# Patient Record
Sex: Male | Born: 1935 | Race: White | Hispanic: No | Marital: Married | Smoking: Former smoker
Health system: Southern US, Community
[De-identification: ages and names within clinical notes are randomized; demographics above are authoritative.]

## PROBLEM LIST (undated history)

## (undated) DIAGNOSIS — I10 Essential (primary) hypertension: Secondary | ICD-10-CM

## (undated) DIAGNOSIS — I495 Sick sinus syndrome: Secondary | ICD-10-CM

## (undated) DIAGNOSIS — K219 Gastro-esophageal reflux disease without esophagitis: Secondary | ICD-10-CM

## (undated) DIAGNOSIS — Z87442 Personal history of urinary calculi: Secondary | ICD-10-CM

## (undated) DIAGNOSIS — C61 Malignant neoplasm of prostate: Secondary | ICD-10-CM

## (undated) DIAGNOSIS — Z950 Presence of cardiac pacemaker: Secondary | ICD-10-CM

## (undated) DIAGNOSIS — D649 Anemia, unspecified: Secondary | ICD-10-CM

## (undated) DIAGNOSIS — E785 Hyperlipidemia, unspecified: Secondary | ICD-10-CM

## (undated) DIAGNOSIS — R32 Unspecified urinary incontinence: Secondary | ICD-10-CM

## (undated) DIAGNOSIS — I4891 Unspecified atrial fibrillation: Secondary | ICD-10-CM

## (undated) HISTORY — PX: OTHER SURGICAL HISTORY: SHX169

## (undated) HISTORY — PX: INSERT / REPLACE / REMOVE PACEMAKER: SUR710

## (undated) HISTORY — DX: Unspecified atrial fibrillation: I48.91

## (undated) HISTORY — DX: Malignant neoplasm of prostate: C61

## (undated) HISTORY — DX: Sick sinus syndrome: I49.5

## (undated) HISTORY — DX: Essential (primary) hypertension: I10

## (undated) HISTORY — DX: Unspecified urinary incontinence: R32

## (undated) HISTORY — DX: Hyperlipidemia, unspecified: E78.5

## (undated) HISTORY — PX: EYE SURGERY: SHX253

## (undated) HISTORY — PX: APPENDECTOMY: SHX54

---

## 2004-10-20 ENCOUNTER — Ambulatory Visit: Payer: Self-pay | Admitting: Gastroenterology

## 2005-09-02 ENCOUNTER — Ambulatory Visit: Payer: Self-pay | Admitting: Internal Medicine

## 2007-06-14 ENCOUNTER — Ambulatory Visit: Payer: Self-pay | Admitting: Internal Medicine

## 2008-12-13 ENCOUNTER — Ambulatory Visit: Payer: Self-pay | Admitting: Internal Medicine

## 2009-02-11 ENCOUNTER — Ambulatory Visit: Payer: Self-pay | Admitting: Gastroenterology

## 2010-03-05 ENCOUNTER — Ambulatory Visit: Payer: Self-pay | Admitting: Otolaryngology

## 2010-03-24 ENCOUNTER — Observation Stay: Payer: Self-pay | Admitting: Internal Medicine

## 2011-03-01 ENCOUNTER — Ambulatory Visit: Payer: Self-pay | Admitting: Gastroenterology

## 2011-04-07 ENCOUNTER — Ambulatory Visit: Payer: Self-pay | Admitting: Gastroenterology

## 2011-05-18 ENCOUNTER — Telehealth: Payer: Self-pay

## 2011-05-18 DIAGNOSIS — R933 Abnormal findings on diagnostic imaging of other parts of digestive tract: Secondary | ICD-10-CM

## 2011-05-18 NOTE — Telephone Encounter (Signed)
Pt has been instructed and meds reviewed, he will call with any further questions or concerns.  Letter sent to Dr Judithann Sheen regarding Pradaxa

## 2011-05-27 ENCOUNTER — Telehealth: Payer: Self-pay

## 2011-05-27 NOTE — Telephone Encounter (Signed)
Message copied by Donata Duff on Thu May 27, 2011  9:46 AM ------      Message from: Donata Duff      Created: Tue May 18, 2011  2:21 PM       Waiting for anti coag results

## 2011-05-27 NOTE — Telephone Encounter (Signed)
I spoke with Candice at Dr Judithann Sheen office and she is getting the fax to the nurse for review

## 2011-05-27 NOTE — Telephone Encounter (Signed)
Call placed to the pt regarding his Pradaxa message left with his wife

## 2011-05-27 NOTE — Telephone Encounter (Signed)
William Ryan with Dr Judithann Sheen called and did not receive a fax she gave me an alternate number to use [phone].  I did refax to that number attn to [place]

## 2011-05-28 NOTE — Telephone Encounter (Signed)
Response from Dr Judithann Sheen ok to stop Pradaxa 5 days before procedure Pt aware  Letter scanned to [facility]

## 2011-05-28 NOTE — Telephone Encounter (Signed)
I called and spoke with Dottie at Dr Judithann Sheen office and she is getting a note back to Dr Judithann Sheen for a response when he returns from lunch around 130 pm

## 2011-06-01 ENCOUNTER — Ambulatory Visit: Payer: Self-pay | Admitting: Internal Medicine

## 2011-06-03 ENCOUNTER — Encounter: Payer: Self-pay | Admitting: Gastroenterology

## 2011-06-03 ENCOUNTER — Ambulatory Visit: Payer: Self-pay

## 2011-06-04 ENCOUNTER — Telehealth: Payer: Self-pay

## 2011-06-04 NOTE — Telephone Encounter (Signed)
Dr Christella Hartigan the [facility] pathologist called with results for the pt, he had a positive result for B cell lymphoma the path report is being faxed as well as soon as it is signed off.

## 2011-06-05 LAB — PATHOLOGY REPORT

## 2011-06-07 ENCOUNTER — Telehealth: Payer: Self-pay | Admitting: Gastroenterology

## 2011-06-07 NOTE — Telephone Encounter (Signed)
See other phone note

## 2011-06-07 NOTE — Telephone Encounter (Signed)
i spoke with him about EUS FNA results.  B cell lymphoma.  I discussed with Dr. Bluford Kaufmann as well. Dr. Bluford Kaufmann will be referring him to [facility].

## 2011-06-08 ENCOUNTER — Encounter: Payer: Self-pay | Admitting: Gastroenterology

## 2011-06-14 ENCOUNTER — Ambulatory Visit: Payer: Self-pay | Admitting: Internal Medicine

## 2011-06-15 LAB — COMPREHENSIVE METABOLIC PANEL
Albumin: 3.6 g/dL (ref 3.4–5.0)
Alkaline Phosphatase: 68 U/L (ref 50–136)
BUN: 17 mg/dL (ref 7–18)
Chloride: 103 mmol/L (ref 98–107)
Co2: 30 mmol/L (ref 21–32)
EGFR (African American): >60
EGFR (Non-African Amer.): 52 — ABNORMAL LOW
Glucose: 167 mg/dL — ABNORMAL HIGH (ref 65–99)
Osmolality: 288 (ref 275–301)
Potassium: 4.2 mmol/L (ref 3.5–5.1)
SGOT(AST): 25 U/L (ref 15–37)
SGPT (ALT): 26 U/L
Sodium: 142 mmol/L (ref 136–145)
Total Protein: 7 g/dL (ref 6.4–8.2)

## 2011-06-15 LAB — CBC CANCER CENTER
Basophil #: 0.1 x10 3/mm (ref 0.0–0.1)
Eosinophil #: 0.1 x10 3/mm (ref 0.0–0.7)
HCT: 41.4 % (ref 40.0–52.0)
HGB: 13.9 g/dL (ref 13.0–18.0)
Lymphocyte #: 3.3 x10 3/mm (ref 1.0–3.6)
Lymphocytes: 44 %
MCHC: 33.7 g/dL (ref 32.0–36.0)
MCV: 91 fL (ref 80–100)
Monocyte %: 6.7 %
Neutrophil #: 4.1 x10 3/mm (ref 1.4–6.5)
Neutrophil %: 50.9 %
Platelet: 187 x10 3/mm (ref 150–440)
RBC: 4.55 10*6/uL (ref 4.40–5.90)
RDW: 15.2 % — ABNORMAL HIGH (ref 11.5–14.5)
WBC: 8 x10 3/mm (ref 3.8–10.6)

## 2011-06-15 LAB — RETICULOCYTES: Absolute Retic Count: 0.061 10*6/uL (ref 0.024–0.084)

## 2011-06-15 LAB — APTT: Activated PTT: 53 secs — ABNORMAL HIGH (ref 23.6–35.9)

## 2011-06-22 ENCOUNTER — Ambulatory Visit: Payer: Self-pay | Admitting: Internal Medicine

## 2011-07-02 ENCOUNTER — Ambulatory Visit: Payer: Self-pay | Admitting: Internal Medicine

## 2011-09-21 ENCOUNTER — Ambulatory Visit: Payer: Self-pay | Admitting: Internal Medicine

## 2011-09-21 LAB — LACTATE DEHYDROGENASE: LDH: 181 U/L (ref 87–241)

## 2011-09-21 LAB — CBC CANCER CENTER
Basophil #: 0 x10 3/mm (ref 0.0–0.1)
Eosinophil #: 0 x10 3/mm (ref 0.0–0.7)
HCT: 42.4 % (ref 40.0–52.0)
Lymphocyte #: 3.2 x10 3/mm (ref 1.0–3.6)
Lymphocyte %: 42.2 %
MCH: 29.6 pg (ref 26.0–34.0)
MCV: 92 fL (ref 80–100)
RBC: 4.59 10*6/uL (ref 4.40–5.90)
RDW: 14.5 % (ref 11.5–14.5)
WBC: 7.6 x10 3/mm (ref 3.8–10.6)

## 2011-09-21 LAB — CREATININE, SERUM
EGFR (African American): >60
EGFR (Non-African Amer.): 53 — ABNORMAL LOW

## 2011-09-29 ENCOUNTER — Ambulatory Visit: Payer: Self-pay | Admitting: Internal Medicine

## 2011-11-15 ENCOUNTER — Emergency Department: Payer: Self-pay | Admitting: *Deleted

## 2011-11-30 ENCOUNTER — Ambulatory Visit: Payer: Self-pay | Admitting: Internal Medicine

## 2011-12-21 ENCOUNTER — Ambulatory Visit: Payer: Self-pay | Admitting: Internal Medicine

## 2011-12-21 LAB — CBC CANCER CENTER
Basophil %: 0.4 %
Eosinophil %: 1.2 %
HCT: 44.9 % (ref 40.0–52.0)
HGB: 14.3 g/dL (ref 13.0–18.0)
Lymphocyte #: 3.4 x10 3/mm (ref 1.0–3.6)
MCH: 29.6 pg (ref 26.0–34.0)
MCHC: 31.8 g/dL — ABNORMAL LOW (ref 32.0–36.0)
MCV: 93 fL (ref 80–100)
Monocyte #: 0.6 x10 3/mm (ref 0.2–1.0)
Monocyte %: 8 %
Neutrophil #: 3.2 x10 3/mm (ref 1.4–6.5)
RBC: 4.83 10*6/uL (ref 4.40–5.90)

## 2011-12-21 LAB — CREATININE, SERUM: EGFR (Non-African Amer.): 48 — ABNORMAL LOW

## 2011-12-30 ENCOUNTER — Ambulatory Visit: Payer: Self-pay | Admitting: Internal Medicine

## 2012-03-21 ENCOUNTER — Ambulatory Visit: Payer: Self-pay | Admitting: Internal Medicine

## 2012-03-21 LAB — CREATININE, SERUM: EGFR (African American): 54 — ABNORMAL LOW

## 2012-03-21 LAB — CBC CANCER CENTER
Basophil #: 0.1 x10 3/mm (ref 0.0–0.1)
Basophil %: 0.9 %
Eosinophil #: 0.1 x10 3/mm (ref 0.0–0.7)
Eosinophil %: 0.9 %
HCT: 41.5 % (ref 40.0–52.0)
HGB: 13.4 g/dL (ref 13.0–18.0)
Lymphocyte #: 4 x10 3/mm — ABNORMAL HIGH (ref 1.0–3.6)
MCH: 29.9 pg (ref 26.0–34.0)
MCHC: 32.2 g/dL (ref 32.0–36.0)
Monocyte #: 0.8 x10 3/mm (ref 0.2–1.0)
Neutrophil #: 4.6 x10 3/mm (ref 1.4–6.5)
Neutrophil %: 48.6 %
RBC: 4.48 10*6/uL (ref 4.40–5.90)

## 2012-03-21 LAB — LACTATE DEHYDROGENASE: LDH: 192 U/L (ref 85–241)

## 2012-03-29 ENCOUNTER — Ambulatory Visit: Payer: Self-pay | Admitting: Ophthalmology

## 2012-03-31 ENCOUNTER — Ambulatory Visit: Payer: Self-pay | Admitting: Internal Medicine

## 2012-04-10 ENCOUNTER — Ambulatory Visit: Payer: Self-pay | Admitting: Ophthalmology

## 2012-06-20 ENCOUNTER — Ambulatory Visit: Payer: Self-pay | Admitting: Internal Medicine

## 2012-06-21 ENCOUNTER — Ambulatory Visit: Payer: Self-pay | Admitting: Internal Medicine

## 2012-06-28 LAB — CBC CANCER CENTER
Eosinophil #: 0.1 x10 3/mm (ref 0.0–0.7)
Eosinophil %: 0.8 %
Lymphocyte #: 3.9 x10 3/mm — ABNORMAL HIGH (ref 1.0–3.6)
Lymphocyte %: 41.5 %
MCH: 30.1 pg (ref 26.0–34.0)
MCHC: 33.1 g/dL (ref 32.0–36.0)
Monocyte #: 0.8 x10 3/mm (ref 0.2–1.0)
Monocyte %: 8.7 %
Neutrophil #: 4.5 x10 3/mm (ref 1.4–6.5)
RDW: 14 % (ref 11.5–14.5)
WBC: 9.4 x10 3/mm (ref 3.8–10.6)

## 2012-06-28 LAB — CREATININE, SERUM
EGFR (African American): >60
EGFR (Non-African Amer.): 53 — ABNORMAL LOW

## 2012-07-01 ENCOUNTER — Ambulatory Visit: Payer: Self-pay | Admitting: Internal Medicine

## 2012-11-24 ENCOUNTER — Ambulatory Visit: Payer: Self-pay | Admitting: Ophthalmology

## 2012-11-24 DIAGNOSIS — Z01810 Encounter for preprocedural cardiovascular examination: Secondary | ICD-10-CM

## 2012-11-28 ENCOUNTER — Ambulatory Visit: Payer: Self-pay | Admitting: Internal Medicine

## 2012-12-04 ENCOUNTER — Ambulatory Visit: Payer: Self-pay | Admitting: Ophthalmology

## 2012-12-25 LAB — CBC CANCER CENTER
Basophil #: 0.1 x10 3/mm (ref 0.0–0.1)
Basophil %: 1.1 %
Eosinophil #: 0.1 x10 3/mm (ref 0.0–0.7)
Eosinophil %: 0.7 %
HCT: 39.7 % — ABNORMAL LOW (ref 40.0–52.0)
HGB: 13.6 g/dL (ref 13.0–18.0)
Lymphocyte #: 4.1 x10 3/mm — ABNORMAL HIGH (ref 1.0–3.6)
Lymphocyte %: 41.1 %
MCHC: 34.3 g/dL (ref 32.0–36.0)
MCV: 91 fL (ref 80–100)
Monocyte #: 0.9 x10 3/mm (ref 0.2–1.0)
Neutrophil #: 4.8 x10 3/mm (ref 1.4–6.5)
Neutrophil %: 48.4 %
Platelet: 203 x10 3/mm (ref 150–440)
RDW: 14 % (ref 11.5–14.5)

## 2012-12-25 LAB — CREATININE, SERUM
Creatinine: 1.4 mg/dL — ABNORMAL HIGH (ref 0.60–1.30)
EGFR (African American): 52 — ABNORMAL LOW
EGFR (Non-African Amer.): 45 — ABNORMAL LOW

## 2012-12-29 ENCOUNTER — Ambulatory Visit: Payer: Self-pay | Admitting: Internal Medicine

## 2013-01-29 ENCOUNTER — Ambulatory Visit: Payer: Self-pay | Admitting: Internal Medicine

## 2013-06-26 ENCOUNTER — Ambulatory Visit: Payer: Self-pay | Admitting: Internal Medicine

## 2013-06-27 LAB — CBC CANCER CENTER
Basophil #: 0.1 x10 3/mm (ref 0.0–0.1)
Basophil %: 0.9 %
EOS ABS: 0.1 x10 3/mm (ref 0.0–0.7)
Eosinophil %: 0.7 %
HCT: 39.4 % — ABNORMAL LOW (ref 40.0–52.0)
HGB: 12.8 g/dL — ABNORMAL LOW (ref 13.0–18.0)
LYMPHS PCT: 37.5 %
Lymphocyte #: 2.9 x10 3/mm (ref 1.0–3.6)
MCH: 29.7 pg (ref 26.0–34.0)
MCHC: 32.3 g/dL (ref 32.0–36.0)
MCV: 92 fL (ref 80–100)
MONO ABS: 0.7 x10 3/mm (ref 0.2–1.0)
MONOS PCT: 9.2 %
NEUTROS PCT: 51.7 %
Neutrophil #: 3.9 x10 3/mm (ref 1.4–6.5)
PLATELETS: 182 x10 3/mm (ref 150–440)
RBC: 4.3 10*6/uL — ABNORMAL LOW (ref 4.40–5.90)
RDW: 14.2 % (ref 11.5–14.5)
WBC: 7.6 x10 3/mm (ref 3.8–10.6)

## 2013-06-27 LAB — CREATININE, SERUM
CREATININE: 1.17 mg/dL (ref 0.60–1.30)
EGFR (African American): >60
EGFR (Non-African Amer.): 55 — ABNORMAL LOW

## 2013-06-27 LAB — LACTATE DEHYDROGENASE: LDH: 218 U/L (ref 85–241)

## 2013-07-01 ENCOUNTER — Ambulatory Visit: Payer: Self-pay | Admitting: Internal Medicine

## 2013-12-26 ENCOUNTER — Ambulatory Visit: Payer: Self-pay | Admitting: Internal Medicine

## 2013-12-27 ENCOUNTER — Ambulatory Visit: Payer: Self-pay | Admitting: Internal Medicine

## 2013-12-28 LAB — CBC CANCER CENTER
BASOS ABS: 0.1 x10 3/mm (ref 0.0–0.1)
Basophil %: 0.9 %
EOS PCT: 0.9 %
Eosinophil #: 0.1 x10 3/mm (ref 0.0–0.7)
HCT: 40.8 % (ref 40.0–52.0)
HGB: 13.2 g/dL (ref 13.0–18.0)
LYMPHS ABS: 3.3 x10 3/mm (ref 1.0–3.6)
Lymphocyte %: 40 %
MCH: 30 pg (ref 26.0–34.0)
MCHC: 32.3 g/dL (ref 32.0–36.0)
MCV: 93 fL (ref 80–100)
Monocyte #: 0.8 x10 3/mm (ref 0.2–1.0)
Monocyte %: 9.5 %
NEUTROS PCT: 48.7 %
Neutrophil #: 4 x10 3/mm (ref 1.4–6.5)
Platelet: 224 x10 3/mm (ref 150–440)
RBC: 4.39 10*6/uL — ABNORMAL LOW (ref 4.40–5.90)
RDW: 14.7 % — ABNORMAL HIGH (ref 11.5–14.5)
WBC: 8.3 x10 3/mm (ref 3.8–10.6)

## 2013-12-28 LAB — LACTATE DEHYDROGENASE: LDH: 200 U/L (ref 85–241)

## 2013-12-28 LAB — CREATININE, SERUM
Creatinine: 1.39 mg/dL — ABNORMAL HIGH (ref 0.60–1.30)
EGFR (African American): 52 — ABNORMAL LOW
GFR CALC NON AF AMER: 45 — AB

## 2013-12-29 ENCOUNTER — Ambulatory Visit: Payer: Self-pay | Admitting: Internal Medicine

## 2014-04-09 ENCOUNTER — Observation Stay: Payer: Self-pay | Admitting: Internal Medicine

## 2014-04-09 LAB — URINALYSIS, COMPLETE
Bacteria: NONE SEEN
Bilirubin,UR: NEGATIVE
Blood: NEGATIVE
Glucose,UR: NEGATIVE mg/dL (ref 0–75)
Ketone: NEGATIVE
Leukocyte Esterase: NEGATIVE
Nitrite: NEGATIVE
Ph: 6 (ref 4.5–8.0)
Protein: NEGATIVE
RBC,UR: 3 /HPF (ref 0–5)
Specific Gravity: 1.014 (ref 1.003–1.030)

## 2014-04-09 LAB — BASIC METABOLIC PANEL
ANION GAP: 8 (ref 7–16)
BUN: 18 mg/dL (ref 7–18)
CALCIUM: 8.8 mg/dL (ref 8.5–10.1)
CREATININE: 1.15 mg/dL (ref 0.60–1.30)
Chloride: 104 mmol/L (ref 98–107)
Co2: 28 mmol/L (ref 21–32)
EGFR (African American): >60
EGFR (Non-African Amer.): >60
Glucose: 134 mg/dL — ABNORMAL HIGH (ref 65–99)
Osmolality: 283 (ref 275–301)
POTASSIUM: 4.1 mmol/L (ref 3.5–5.1)
Sodium: 140 mmol/L (ref 136–145)

## 2014-04-09 LAB — CBC WITH DIFFERENTIAL/PLATELET
Basophil #: 0 10*3/uL (ref 0.0–0.1)
Basophil %: 0.2 %
Eosinophil #: 0 10*3/uL (ref 0.0–0.7)
Eosinophil %: 0 %
HCT: 42.7 % (ref 40.0–52.0)
HGB: 13.9 g/dL (ref 13.0–18.0)
Lymphocyte #: 1.3 10*3/uL (ref 1.0–3.6)
Lymphocyte %: 12.7 %
MCH: 30.7 pg (ref 26.0–34.0)
MCHC: 32.5 g/dL (ref 32.0–36.0)
MCV: 95 fL (ref 80–100)
Monocyte #: 0.6 x10 3/mm (ref 0.2–1.0)
Monocyte %: 6.2 %
Neutrophil #: 8.2 10*3/uL — ABNORMAL HIGH (ref 1.4–6.5)
Neutrophil %: 80.9 %
Platelet: 199 10*3/uL (ref 150–440)
RBC: 4.52 10*6/uL (ref 4.40–5.90)
RDW: 14.3 % (ref 11.5–14.5)
WBC: 10.1 10*3/uL (ref 3.8–10.6)

## 2014-04-09 LAB — CK: CK, TOTAL: 367 U/L — AB

## 2014-04-09 LAB — TROPONIN I: Troponin-I: <0.02 ng/mL

## 2014-04-11 LAB — BASIC METABOLIC PANEL
ANION GAP: 4 — AB (ref 7–16)
BUN: 22 mg/dL — ABNORMAL HIGH (ref 7–18)
CALCIUM: 7.9 mg/dL — AB (ref 8.5–10.1)
CO2: 28 mmol/L (ref 21–32)
Chloride: 109 mmol/L — ABNORMAL HIGH (ref 98–107)
Creatinine: 1.23 mg/dL (ref 0.60–1.30)
GFR CALC NON AF AMER: 59 — AB
GLUCOSE: 100 mg/dL — AB (ref 65–99)
Osmolality: 285 (ref 275–301)
Potassium: 4 mmol/L (ref 3.5–5.1)
Sodium: 141 mmol/L (ref 136–145)

## 2014-04-11 LAB — CBC WITH DIFFERENTIAL/PLATELET
Basophil #: 0 10*3/uL (ref 0.0–0.1)
Basophil %: 0.4 %
Eosinophil #: 0.1 10*3/uL (ref 0.0–0.7)
Eosinophil %: 1.3 %
HCT: 38.1 % — ABNORMAL LOW (ref 40.0–52.0)
HGB: 12.6 g/dL — ABNORMAL LOW (ref 13.0–18.0)
LYMPHS ABS: 4.8 10*3/uL — AB (ref 1.0–3.6)
LYMPHS PCT: 44.4 %
MCH: 30.8 pg (ref 26.0–34.0)
MCHC: 33 g/dL (ref 32.0–36.0)
MCV: 93 fL (ref 80–100)
MONO ABS: 0.9 x10 3/mm (ref 0.2–1.0)
MONOS PCT: 8.8 %
NEUTROS ABS: 4.8 10*3/uL (ref 1.4–6.5)
Neutrophil %: 45.1 %
Platelet: 177 10*3/uL (ref 150–440)
RBC: 4.09 10*6/uL — ABNORMAL LOW (ref 4.40–5.90)
RDW: 14.4 % (ref 11.5–14.5)
WBC: 10.7 10*3/uL — ABNORMAL HIGH (ref 3.8–10.6)

## 2014-04-11 LAB — CK: CK, Total: 472 U/L — ABNORMAL HIGH

## 2014-04-12 LAB — CBC WITH DIFFERENTIAL/PLATELET
BASOS ABS: 0 10*3/uL (ref 0.0–0.1)
BASOS PCT: 0.3 %
EOS PCT: 2 %
Eosinophil #: 0.2 10*3/uL (ref 0.0–0.7)
HCT: 36.2 % — AB (ref 40.0–52.0)
HGB: 11.7 g/dL — AB (ref 13.0–18.0)
LYMPHS PCT: 42.8 %
Lymphocyte #: 4.2 10*3/uL — ABNORMAL HIGH (ref 1.0–3.6)
MCH: 30.6 pg (ref 26.0–34.0)
MCHC: 32.3 g/dL (ref 32.0–36.0)
MCV: 95 fL (ref 80–100)
MONO ABS: 0.9 x10 3/mm (ref 0.2–1.0)
Monocyte %: 9.1 %
NEUTROS ABS: 4.5 10*3/uL (ref 1.4–6.5)
NEUTROS PCT: 45.8 %
PLATELETS: 174 10*3/uL (ref 150–440)
RBC: 3.82 10*6/uL — ABNORMAL LOW (ref 4.40–5.90)
RDW: 14.6 % — ABNORMAL HIGH (ref 11.5–14.5)
WBC: 9.9 10*3/uL (ref 3.8–10.6)

## 2014-04-12 LAB — BASIC METABOLIC PANEL WITH GFR
Anion Gap: 11
BUN: 19 mg/dL — ABNORMAL HIGH
Calcium, Total: 7.5 mg/dL — ABNORMAL LOW
Chloride: 111 mmol/L — ABNORMAL HIGH
Co2: 26 mmol/L
Creatinine: 1.21 mg/dL
EGFR (African American): >60
EGFR (Non-African Amer.): >60
Glucose: 94 mg/dL
Osmolality: 296
Potassium: 3.9 mmol/L
Sodium: 148 mmol/L — ABNORMAL HIGH

## 2014-04-12 LAB — BASIC METABOLIC PANEL
BUN: 19 mg/dL (ref 4–21)
Creatinine: 1.2 mg/dL (ref 0.6–1.3)
GLUCOSE: 94 mg/dL
POTASSIUM: 3.9 mmol/L (ref 3.4–5.3)
Sodium: 148 mmol/L — AB (ref 137–147)

## 2014-04-12 LAB — CBC AND DIFFERENTIAL
HEMATOCRIT: 36 % — AB (ref 41–53)
Hemoglobin: 11.7 g/dL — AB (ref 13.5–17.5)
Platelets: 174 10*3/uL (ref 150–399)
WBC: 9.9 10*3/mL

## 2014-04-12 LAB — CK: CK, Total: 275 U/L

## 2014-04-15 ENCOUNTER — Non-Acute Institutional Stay (SKILLED_NURSING_FACILITY): Payer: Medicare Other | Admitting: Registered Nurse

## 2014-04-15 DIAGNOSIS — I639 Cerebral infarction, unspecified: Secondary | ICD-10-CM

## 2014-04-15 DIAGNOSIS — F0280 Dementia in other diseases classified elsewhere without behavioral disturbance: Secondary | ICD-10-CM

## 2014-04-15 DIAGNOSIS — G20 Parkinson's disease: Secondary | ICD-10-CM

## 2014-04-15 DIAGNOSIS — M6281 Muscle weakness (generalized): Secondary | ICD-10-CM

## 2014-04-15 DIAGNOSIS — I10 Essential (primary) hypertension: Secondary | ICD-10-CM

## 2014-04-15 DIAGNOSIS — B370 Candidal stomatitis: Secondary | ICD-10-CM

## 2014-04-15 DIAGNOSIS — I480 Paroxysmal atrial fibrillation: Secondary | ICD-10-CM

## 2014-04-15 DIAGNOSIS — R32 Unspecified urinary incontinence: Secondary | ICD-10-CM

## 2014-04-17 ENCOUNTER — Encounter: Payer: Self-pay | Admitting: Registered Nurse

## 2014-04-17 DIAGNOSIS — F0280 Dementia in other diseases classified elsewhere without behavioral disturbance: Secondary | ICD-10-CM | POA: Insufficient documentation

## 2014-04-17 DIAGNOSIS — I10 Essential (primary) hypertension: Secondary | ICD-10-CM | POA: Insufficient documentation

## 2014-04-17 DIAGNOSIS — G20 Parkinson's disease: Secondary | ICD-10-CM

## 2014-04-17 DIAGNOSIS — I480 Paroxysmal atrial fibrillation: Secondary | ICD-10-CM | POA: Insufficient documentation

## 2014-04-17 NOTE — Progress Notes (Signed)
Patient ID: William Ryan, male   DOB: 1922-05-26, 80 y.o.   MRN: [id number]   Place of Service: [facility]  Allergies not on file  Code Status: Full Code  Goals of Care: Longevity/STR  Chief Complaint  Patient presents with  . Hospitalization Follow-up    HPI 80 y.o. male with PMH of PD, HTN, urinary incontinence, dementia, paroxysmal afib is being seen for a post hospital follow up. He was hospitalized from 04/09/14 to 04/12/14 for remote lacunar infarcts of bilateral basal ganglia and infarct of right insular cortex. Family at bedside. No complaints verbalized from patient.  Review of Systems Constitutional: Negative for fever and chills HENT: Negative for congestion and sore throat Eyes: Negative for eye pain, eye discharge, and visual disturbance  Cardiovascular: Negative for chest pain, palpitations, and leg swelling Respiratory: Negative cough, shortness of breath, and wheezing.  Gastrointestinal: Negative for nausea and vomiting.  Genitourinary: Negative for  dysuria Musculoskeletal: Negative for back pain, joint pain, and joint swelling  Neurological: Negative for dizziness and headache Skin: Negative for rash Psychiatric: Negative for depression.     Medication List       This list is accurate as of: 04/15/14 11:59 PM.  Always use your most recent med list.               aspirin 81 MG tablet  Take 81 mg by mouth daily.     carbidopa-levodopa 25-100 MG per tablet  Commonly known as:  SINEMET IR  Take 1 tablet by mouth 3 (three) times daily.     dabigatran 75 MG Caps capsule  Commonly known as:  PRADAXA  Take 75 mg by mouth 2 (two) times daily.     losartan 50 MG tablet  Commonly known as:  COZAAR  Take 50 mg by mouth daily.     tolterodine 4 MG 24 hr capsule  Commonly known as:  DETROL LA  Take 4 mg by mouth daily.        Physical Exam Filed Vitals:   04/15/14 1145  BP: 142/81  Pulse: 72  Temp: 98.3 F (36.8 C)  Resp: 18    Constitutional: WDWN elderly male in no acute distress. Pleasant. conversant HEENT: Normocephalic and atraumatic. PERRL. EOM intact. No icterus.  No nasal discharge or sinus tenderness. Oral mucosa moist. Creamy white lesions noted on soft palate. Dentures in place Neck: Supple and nontender. Bilateral submandibular lymphadenopathy, nontender to palpation. No thyromegaly. No JVD or carotid bruits. Cardiac: Normal S1, S2. RRR without appreciable murmurs, rubs, or gallops. Distal pulses intact. No dependent edema.  Lungs: No respiratory distress. Breath sounds clear bilaterally without rales, rhonchi, or wheezes. Abdomen: Audible bowel sounds in all quadrants. Soft, nontender, nondistended.  Musculoskeletal: Able to move all extremities. Weakness on left side. No joint erythema or tenderness. Coarse hand resting tremors noted bilaterally.  Skin: Warm and dry. No rash noted. No erythema.  Neurological: Alert and oriented to person Psychiatric: Appropriate mood and affect.   Labs Reviewed CBC    Component Value Date/Time   WBC 9.9 04/12/2014   HGB 11.7* 04/12/2014   HCT 36* 04/12/2014   PLT 174 04/12/2014    CMP     Component Value Date/Time   NA 148* 04/12/2014   K 3.9 04/12/2014   BUN 19 04/12/2014   CREATININE 1.2 04/12/2014    Diagnostic Studies Reviewed 04/12/14-CT Head w/o Contrast Impression: 1. Stable atrophy and white matter disease 2. Stable remote lacunar infarcts of the basal  ganglia bilaterally 3. Stable remote infarct of the right insular cortex  04/10/14-Portable CXR Findings:  Left single lead pacer remains in place, unchanged. Heart is normal in size. Lungs are clear. No effusions. No acute bony abnormality.  Impression: No active disease  Assessment & Plan 1. Essential hypertension Stable. Continue losartan 50mg  daily and monitor  2. Parkinson's disease dementia Stable. Continue sinemet 25/100mg  TID and monitor. Continue fall risk and pressure ulcer  precautions  3. Paroxysmal a-fib Heart RRR on exam. Continue anticoagulant with pradaxa 75mg  bid.   4. CVA (cerebrovascular accident) Has global weakness left >right. Continue low dose asa and monitor. F/u with Dr.Sparks at the [facility] clinic in 1-2 weeks or sooner if needed.   5. Muscle weakness-general Continue to work with PT/OT for gait/strength/balance training and ADLs care.  6. Urinary incontinence, unspecified incontinence type Stable. Continue deltrol 4mg  daily and monitor  7. Thrush, oral Start nystatin 100,000 units/mL: 5 mL QID swish and swallow x 7 days. Continue to monitor.   Time spent: more than 45 minutes   Family/Staff Communication Plan of care discuss with patient, family, and nursing staff. Patient, family, and nursing staff verbalize understanding and agree with plan of care. No additional questions or concerns reported.    Arthur Holms, MSN, AGNP-C [facility] [address] [place], [place] [address] [phone] [8am-5pm] After hours: [phone]

## 2014-04-19 ENCOUNTER — Non-Acute Institutional Stay (SKILLED_NURSING_FACILITY): Payer: Medicare Other | Admitting: Internal Medicine

## 2014-04-19 DIAGNOSIS — I10 Essential (primary) hypertension: Secondary | ICD-10-CM

## 2014-04-19 DIAGNOSIS — I480 Paroxysmal atrial fibrillation: Secondary | ICD-10-CM

## 2014-04-19 DIAGNOSIS — R32 Unspecified urinary incontinence: Secondary | ICD-10-CM

## 2014-04-19 DIAGNOSIS — I639 Cerebral infarction, unspecified: Secondary | ICD-10-CM

## 2014-04-19 DIAGNOSIS — G20 Parkinson's disease: Secondary | ICD-10-CM

## 2014-04-19 DIAGNOSIS — R531 Weakness: Secondary | ICD-10-CM

## 2014-04-19 DIAGNOSIS — F0280 Dementia in other diseases classified elsewhere without behavioral disturbance: Secondary | ICD-10-CM

## 2014-04-19 NOTE — Progress Notes (Signed)
Patient ID: William Ryan, male   DOB: [birth year], 80 y.o.   MRN: [id number]     Facility: [facility]    PCP: No primary care provider on file.  Code Status: full code  No Known Allergies  Chief Complaint  Patient presents with  . New Admit To SNF     HPI:  80 y/o male pt is here for STR post hospital admission from 04/09/14 to 04/12/14 for remote lacunar infarcts of bilateral basal ganglia and infarct of right insular cortex. He was seen by neurology. His aspirin was continued. He denies any other concerns.  He has PMH of PD, HTN, urinary incontinence, dementia, paroxysmal afib.   Review of Systems:  Constitutional: Negative for fever, chills Respiratory: Negative for cough, shortness of breath and wheezing.   Cardiovascular: Negative for chest pain, palpitations, orthopnea and leg swelling.  Gastrointestinal: Negative for heartburn, nausea, vomiting, abdominal pain Genitourinary: Negative for dysuria Musculoskeletal: Negative for back pain, falls Neurological: Negative for dizziness, tingling, fheadaches.  Psychiatric/Behavioral: Negative for depression  Past Medical History  Diagnosis Date  . HLD (hyperlipidemia)   . HTN (hypertension)   . Sick sinus syndrome   . Prostate cancer   . Urinary incontinence   . A-fib    Past Surgical History  Procedure Laterality Date  . Prostatectomy     Social History:   reports that he has quit smoking. He does not have any smokeless tobacco history on file. His alcohol and drug histories are not on file.  No family history on file.  Medications: Patient's Medications  New Prescriptions   No medications on file  Previous Medications   ASPIRIN 81 MG TABLET    Take 81 mg by mouth daily.   CARBIDOPA-LEVODOPA (SINEMET IR) 25-100 MG PER TABLET    Take 1 tablet by mouth 3 (three) times daily.   DABIGATRAN (PRADAXA) 75 MG CAPS CAPSULE    Take 75 mg by mouth 2 (two) times daily.   LOSARTAN (COZAAR) 50  MG TABLET    Take 50 mg by mouth daily.   TOLTERODINE (DETROL LA) 4 MG 24 HR CAPSULE    Take 4 mg by mouth daily.  Modified Medications   No medications on file  Discontinued Medications   No medications on file     Physical Exam: VS reviewed, stable, nursing note reviewed, afebrile  General- elderly male in no acute distress Head- atraumatic, normocephalic Eyes- PERRLA, EOMI, no pallor, no icterus, no discharge Cardiovascular- normal s1,s2, no murmurs/ rubs/ gallops, dorsalis pedis Respiratory- bilateral clear to auscultation, no wheeze, no rhonchi, no crackles, no use of accessory muscles Abdomen- bowel sounds present, soft, non tender Musculoskeletal- able to move all 4 extremities, left sided weakness noted, slight limp present, resting tremor in hand, trace leg edema Neurological- no focal deficit Skin- warm and dry Psychiatry- normal mood and affect    Labs reviewed: Basic Metabolic Panel:  Recent Labs  04/12/14  NA 148*  K 3.9  BUN 19  CREATININE 1.2   CBC:  Recent Labs  04/12/14  WBC 9.9  HGB 11.7*  HCT 36*  PLT 174    Radiological Exams: 04/12/14-CT Head w/o Contrast Impression: 1. Stable atrophy and white matter disease 2. Stable remote lacunar infarcts of the basal ganglia bilaterally 3. Stable remote infarct of the right insular cortex  04/10/14-Portable CXR Findings:   Left single lead pacer remains in place, unchanged. Heart is normal in size. Lungs are clear. No effusions. No  acute bony abnormality.   Impression: No active disease  Assessment/plan  Generalized weakness Will have patient work with PT/OT as tolerated to regain strength and restore function.  Fall precautions are in place.  CVA With left sided weakness. Here for therapy. Continue aspirin and has f/u with nuerology. Monitor bo and continue bp med  HTN Stable, continue losartan and monitor bp  Parkinson's disease dementia Stable. Continue sinemet 25/100mg  TID and  monitor. Continue fall risk precautions  a-fib Rate controlled. Continue pradaxa for anticoagulation  Urinary incontinence Stable. Continue deltrol 4mg  daily   Family/ staff Communication: reviewed care plan with patient and nursing supervisor   Goals of care: short term rehabilitation    Labs/tests ordered- cbc, bmp in 1 week    Blanchie Serve, MD  [place] [phone] (Monday-Friday 8 am - 5 pm) [phone] (afterhours)

## 2014-04-22 ENCOUNTER — Encounter: Payer: Self-pay | Admitting: Registered Nurse

## 2014-04-22 ENCOUNTER — Non-Acute Institutional Stay (SKILLED_NURSING_FACILITY): Payer: Medicare Other | Admitting: Registered Nurse

## 2014-04-22 DIAGNOSIS — M6281 Muscle weakness (generalized): Secondary | ICD-10-CM

## 2014-04-22 DIAGNOSIS — I69354 Hemiplegia and hemiparesis following cerebral infarction affecting left non-dominant side: Secondary | ICD-10-CM

## 2014-04-22 DIAGNOSIS — F0280 Dementia in other diseases classified elsewhere without behavioral disturbance: Secondary | ICD-10-CM

## 2014-04-22 DIAGNOSIS — I69854 Hemiplegia and hemiparesis following other cerebrovascular disease affecting left non-dominant side: Secondary | ICD-10-CM

## 2014-04-22 DIAGNOSIS — I10 Essential (primary) hypertension: Secondary | ICD-10-CM

## 2014-04-22 DIAGNOSIS — I480 Paroxysmal atrial fibrillation: Secondary | ICD-10-CM

## 2014-04-22 DIAGNOSIS — R32 Unspecified urinary incontinence: Secondary | ICD-10-CM

## 2014-04-22 DIAGNOSIS — G20 Parkinson's disease: Secondary | ICD-10-CM

## 2014-04-22 NOTE — Progress Notes (Signed)
Patient ID: William Ryan, male   DOB: [birth year], 80 y.o.   MRN: [id number]   Place of Service: [facility]  No Known Allergies  Code Status: Full Code  Goals of Care: Longevity/STR  Chief Complaint  Patient presents with  . Discharge Note    HPI 80 y.o. male with PMH of  of PD, HTN, urinary incontinence, dementia, paroxysmal afib is being seen for a discharge visit. He was here for STR post hospitalization from11/10/15 to 04/12/14 for remote lacunar infarcts of bilateral basal ganglia and infarct of right insular cortex. He has worked with therapy team and is ready to be discharge home with [facility] PT/OT/ST.   Review of Systems Constitutional: Negative for fever, chills, and fatigue. HENT: Negative for congestion, and sore throat Eyes: Negative for eye pain, eye discharge, and visual disturbance  Cardiovascular: Negative for chest pain, palpitations, and leg swelling Respiratory: Negative cough, shortness of breath, and wheezing.  Gastrointestinal: Negative for nausea and vomiting. Negative for abdominal pain, diarrhea and constipation.  Genitourinary: Negative for  dysuria Musculoskeletal: Negative for back pain, joint pain, and joint swelling  Neurological: Negative for dizziness, headache Skin: Negative for rash and wound.   Psychiatric: Negative for depression  Past Medical History  Diagnosis Date  . HLD (hyperlipidemia)   . HTN (hypertension)   . Sick sinus syndrome   . Prostate cancer   . Urinary incontinence   . A-fib     Past Surgical History  Procedure Laterality Date  . Prostatectomy      History   Social History  . Marital Status: Married    Spouse Name: N/A    Number of Children: N/A  . Years of Education: N/A   Occupational History  . Not on file.   Social History Main Topics  . Smoking status: Former Research scientist (life sciences)  . Smokeless tobacco: Not on file  . Alcohol Use: Not on file  . Drug Use: Not on file  . Sexual Activity: Not on file    Other Topics Concern  . Not on file   Social History Narrative      Medication List       This list is accurate as of: 04/22/14 11:59 PM.  Always use your most recent med list.               aspirin 81 MG tablet  Take 81 mg by mouth daily.     carbidopa-levodopa 25-100 MG per tablet  Commonly known as:  SINEMET IR  Take 1 tablet by mouth 3 (three) times daily.     dabigatran 75 MG Caps capsule  Commonly known as:  PRADAXA  Take 75 mg by mouth 2 (two) times daily.     losartan 50 MG tablet  Commonly known as:  COZAAR  Take 50 mg by mouth daily.     tolterodine 4 MG 24 hr capsule  Commonly known as:  DETROL LA  Take 4 mg by mouth daily.        Physical Exam Filed Vitals:   04/22/14 2038  BP: 122/76  Pulse: 82  Temp: 97.1 F (36.2 C)  Resp: 20   Constitutional: WDWN elderly male in no acute distress. Conversant and pleasant HEENT: Normocephalic and atraumatic. PERRL. EOM intact. No icterus. Oral mucosa moist. Posterior pharynx clear of any exudate or lesions.  Neck: Supple and nontender. No lymphadenopathy, masses, or thyromegaly. No JVD or carotid bruits. Cardiac: Normal S1, S2. RRR without appreciable murmurs, rubs, or gallops.  Distal pulses intact. 1+ pitting edema of BLE Lungs: No respiratory distress. Breath sounds clear bilaterally without rales, rhonchi, or wheezes. Abdomen: Audible bowel sounds in all quadrants. Soft, nontender, nondistended. No palpable mass.  Musculoskeletal: Able to move all extremities. Strength diminished 4/5. No joint erythema or tenderness. Skin: Warm and dry. No rash noted. No erythema.  Neurological: Alert and oriented to person Psychiatric:  Appropriate mood and affect.   Labs Reviewed CBC Latest Ref Rng 04/12/2014  WBC - 9.9  Hemoglobin 13.5 - 17.5 g/dL 11.7(A)  Hematocrit 41 - 53 % 36(A)  Platelets 150 - 399 K/L 174    CMP     Component Value Date/Time   NA 148* 04/12/2014   K 3.9 04/12/2014   BUN 19  04/12/2014   CREATININE 1.2 04/12/2014    Diagnostic Studies Reviewed 04/12/14-CT Head w/o Contrast Impression: 1. Stable atrophy and white matter disease 2. Stable remote lacunar infarcts of the basal ganglia bilaterally 3. Stable remote infarct of the right insular cortex  04/10/14-Portable CXR Findings:  Left single lead pacer remains in place, unchanged. Heart is normal in size. Lungs are clear. No effusions. No acute bony abnormality.  Impression: No active disease  Assessment & Plan 1. Essential hypertension BP stable with SBPs in 120s. Continue losartan 50mg  daily. F/u with PCP  2. Paroxysmal a-fib Stable. Rate-controlled. Continue anticoagulation with pradaxa 75mg  twice daily and monitor for signs of bleeding.   3. Parkinson's disease dementia Ongoing. Continue sinemet 25/100mg  three times daily and work with home health speech therapy for safety strategies/cogntion. Continue fall risk precautions  4. Muscle weakness-general Stable. Continue HH PT/OT for strength/gait/and balance training.   5. Urinary incontinence, unspecified incontinence type Stable. Continue tolterodine 4mg  daily.   6. Hemiparesis affecting left side as late effect of stroke Stable. Continue low dose asa daily and work with [facility] PT/OT.   Please have your PCP check CMP and CBC at your appt.   Home health services: PT/OT/ST DME required: None PCP follow-up: 04/24/14 with Dr. Doy Hutching  30-day supply of prescription medications provided  Family/Staff Communication Plan of care discuss with patient, family, and nursing staff. Patient, family, and nursing staff verbalize understanding and agree with plan of care. No additional questions or concerns reported.     Arthur Holms, MSN, AGNP-C [facility] [address] [place],  [address] [phone] [8am-5pm] After hours: [phone]

## 2014-04-23 ENCOUNTER — Encounter: Payer: Self-pay | Admitting: Registered Nurse

## 2014-04-28 DIAGNOSIS — R32 Unspecified urinary incontinence: Secondary | ICD-10-CM | POA: Insufficient documentation

## 2014-04-28 DIAGNOSIS — I639 Cerebral infarction, unspecified: Secondary | ICD-10-CM | POA: Insufficient documentation

## 2014-06-28 ENCOUNTER — Ambulatory Visit: Payer: Self-pay | Admitting: Internal Medicine

## 2014-06-28 LAB — CREATININE, SERUM
CREATININE: 1.23 mg/dL (ref 0.60–1.30)
EGFR (African American): >60
EGFR (Non-African Amer.): 59 — ABNORMAL LOW

## 2014-06-28 LAB — CBC CANCER CENTER
Basophil #: 0 x10 3/mm (ref 0.0–0.1)
Basophil %: 0.6 %
EOS ABS: 0.1 x10 3/mm (ref 0.0–0.7)
Eosinophil %: 0.7 %
HCT: 38.4 % — ABNORMAL LOW (ref 40.0–52.0)
HGB: 12.8 g/dL — ABNORMAL LOW (ref 13.0–18.0)
LYMPHS PCT: 39.9 %
Lymphocyte #: 3.2 x10 3/mm (ref 1.0–3.6)
MCH: 30.5 pg (ref 26.0–34.0)
MCHC: 33.3 g/dL (ref 32.0–36.0)
MCV: 92 fL (ref 80–100)
MONO ABS: 0.7 x10 3/mm (ref 0.2–1.0)
Monocyte %: 9.4 %
NEUTROS PCT: 49.4 %
Neutrophil #: 3.9 x10 3/mm (ref 1.4–6.5)
PLATELETS: 204 x10 3/mm (ref 150–440)
RBC: 4.19 10*6/uL — ABNORMAL LOW (ref 4.40–5.90)
RDW: 14 % (ref 11.5–14.5)
WBC: 7.9 x10 3/mm (ref 3.8–10.6)

## 2014-06-28 LAB — LACTATE DEHYDROGENASE: LDH: 178 U/L (ref 85–241)

## 2014-07-01 ENCOUNTER — Ambulatory Visit: Payer: Self-pay | Admitting: Internal Medicine

## 2014-09-17 NOTE — Op Note (Signed)
PATIENT NAME:  William Ryan, William Ryan MR#:  [id number] DATE OF BIRTH:  11/22/1921  DATE OF PROCEDURE:  04/10/2012  PREOPERATIVE DIAGNOSIS:  Cataract, right eye.   POSTOPERATIVE DIAGNOSIS:  Cataract, right eye.  PROCEDURE PERFORMED:  Extracapsular cataract extraction using phacoemulsification with placement of an Alcon SN6CWS, 13.5-diopter posterior chamber lens, serial [id number].  SURGEON:  Loura Back. Lowen Barringer, MD  ASSISTANT:  None.  ANESTHESIA:  4% lidocaine and 0.75% Marcaine in a 50/50 mixture with 10 units/mL of Hylenex added, given as a peribulbar.   ANESTHESIOLOGIST:  Dr. Benjamine Mola  COMPLICATIONS:  None.  ESTIMATED BLOOD LOSS:  Less than 1 ml.  DESCRIPTION OF PROCEDURE:  The patient was brought to the operating room and given a peribulbar block.  The patient was then prepped and draped in the usual fashion.  The vertical rectus muscles were imbricated using 5-0 silk sutures.  These sutures were then clamped to the sterile drapes as bridle sutures.  A limbal peritomy was performed extending two clock hours and hemostasis was obtained with cautery.  A partial thickness scleral groove was made at the surgical limbus and dissected anteriorly in a lamellar dissection using an Alcon crescent knife.  The anterior chamber was entered superonasally with a Superblade and through the lamellar dissection with a 2.6 mm keratome.  DisCoVisc was used to replace the aqueous and a continuous tear capsulorrhexis was carried out.  Hydrodissection and hydrodelineation were carried out with balanced salt and a 27 gauge canula.  The nucleus was rotated to confirm the effectiveness of the hydrodissection.  Phacoemulsification was carried out using a divide-and-conquer technique.  Total ultrasound time was 1 minute and 39 seconds with an average power of 19.3 percent and CDE of 34.68.  Irrigation/aspiration was used to remove the residual cortex.  DisCoVisc was used to inflate the capsule and the internal incision  was enlarged to 3 mm with the crescent knife.  The intraocular lens was folded and inserted into the capsular bag using the AcrySert delivery system.  Irrigation/aspiration was used to remove the residual DisCoVisc.  Miostat was injected into the anterior chamber through the paracentesis track to inflate the anterior chamber and induce miosis.  The wound was checked for leaks and none were found. The conjunctiva was closed with cautery and the bridle sutures were removed.  Two drops of 0.3% Vigamox were placed on the eye.   An eye shield was placed on the eye.  The patient was discharged to the recovery room in good condition. ____________________________ Loura Back Deyton Ellenbecker, MD sad:slb D: 04/10/2012 13:16:40 ET T: 04/10/2012 13:35:31 ET JOB#: [id number]  cc: Remo Lipps A. Marguerite Jarboe, MD, <Dictator> Martie Lee MD ELECTRONICALLY SIGNED 04/24/2012 14:39

## 2014-09-20 NOTE — Op Note (Signed)
PATIENT NAME:  William Ryan, William Ryan MR#:  [id number] DATE OF BIRTH:  02-Oct-1921  DATE OF PROCEDURE:  12/04/2012  PREOPERATIVE DIAGNOSIS:  Cataract, left eye.   POSTOPERATIVE DIAGNOSIS:  Cataract, left eye.  PROCEDURE PERFORMED:  Extracapsular cataract extraction using phacoemulsification with placement of Alcon SN6CWS, 13.0 diopter posterior chamber lens, serial number [id number].   SURGEON:  Loura Back. Jeweldean Drohan, MD  ANESTHESIA: 4% lidocaine, 0.75% Marcaine, 50-50 mixture of 10 units/mL of Hylenex  added, given as peribulbar.   ANESTHESIOLOGIST: Dr. Carolin Sicks   COMPLICATIONS: None.   ESTIMATED BLOOD LOSS:  Less than 1 mL.   DESCRIPTION OF PROCEDURE:  The patient was brought to the operating room and given a peribulbar block.  The patient was then prepped and draped in the usual fashion.  The vertical rectus muscles were imbricated using 5-0 silk sutures.  These sutures were then clamped to the sterile drapes as bridle sutures.  A limbal peritomy was performed extending two clock hours and hemostasis was obtained with cautery.  A partial thickness scleral groove was made at the surgical limbus and dissected anteriorly in a lamellar dissection using an Alcon crescent knife.  The anterior chamber was entered supero-temporally with a Superblade and through the lamellar dissection with a 2.6 mm keratome.  DisCoVisc was used to replace the aqueous and a continuous tear capsulorrhexis was carried out.  Hydrodissection and hydrodelineation were carried out with balanced salt and a 27 gauge canula.  The nucleus was rotated to confirm the effectiveness of the hydrodissection.  Phacoemulsification was carried out using a divide-and-conquer technique.  Total ultrasound time was 1 minute and 45 seconds with an average power of  23.3% percent.  CDE of 37.89.  Irrigation/aspiration was used to remove the residual cortex.  DisCoVisc was used to inflate the capsule and the internal incision was enlarged to 3 mm  with the crescent knife.  The intraocular lens was folded and inserted into the capsular bag using the AcrySert delivery system.  Irrigation/aspiration was used to remove the residual DisCoVisc.  Miostat was injected into the anterior chamber through the paracentesis track to inflate the anterior chamber and induce miosis.  The wound was checked for leaks and none were found. The conjunctiva was closed with cautery and the bridle sutures were removed.  Two drops of 0.3% Vigamox were placed on the eye.   An eye shield was placed on the eye.  The patient was discharged to the recovery room in good condition.    ____________________________ Loura Back Kenichi Cassada, MD sad:mr D: 12/04/2012 13:26:50 ET T: 12/04/2012 20:31:35 ET JOB#: [id number]  cc: Remo Lipps A. Aleathea Pugmire, MD, <Dictator> Martie Lee MD ELECTRONICALLY SIGNED 12/11/2012 12:13

## 2014-09-21 NOTE — Consult Note (Signed)
Referring Physician:  Idelle Crouch :   Primary Care Physician:  Darol Destine, [address] [place], [place] [address], [place] [phone]  Reason for Consult: Admit Date: 10-Apr-2014  Chief Complaint: weakness  Reason for Consult: weakness   History of Present Illness: History of Present Illness:   80 yo RHD M presents to [facility] after falling and being unable to get up.  Pt is accompanied by his daughter who provides most of the history.  Apparently pt has had a L arm tremor for several years and has had difficulty getting out of chairs.  He has had a couple of episodes where he could not even get up when he falls.  He has also had some memory issues over the past 6 months as well but is still able to do most of his ADLs.  ROS:  General denies complaints    HEENT no complaints    Lungs no complaints    Cardiac no complaints    GI no complaints    GU no complaints    Musculoskeletal no complaints    Extremities no complaints    Skin no complaints    Neuro no complaints    Endocrine no complaints    Psych no complaints    Past Medical/Surgical Hx:  atrial fibrillation:   lymphoma:   Hypercholesterolemia:   HTN:   Pacemaker:   Polypectomy:   Past Medical/ Surgical Hx:  Past Medical History personally reviewed by me and normal   Past Surgical History personally reviewed by me and normal   Home Medications: Medication Instructions Last Modified Date/Time  Pradaxa 75 mg oral capsule 1 cap(s) orally 2 times a day 10-Nov-15 14:17  losartan 50 mg oral tablet 1 tab(s) orally once a day 10-Nov-15 14:17  Detrol LA 4 mg oral capsule, extended release 1 cap(s) orally once a day 10-Nov-15 14:17  Aspirin Enteric Coated 81 mg oral delayed release tablet 1 tab(s) orally once a day 10-Nov-15 14:17   Allergies:  No Known Allergies:   Allergies:  Allergies NKDA    Social/Family History: Employment Status: retired  Lives With: children   Living Arrangements: house  Social History: no tob, no EtOH, no illicits  Family History: no strokes, no seizures   Vital Signs: **Vital Signs.:   12-Nov-15 04:22  Vital Signs Type Routine  Temperature Temperature (F) 98.3  Celsius 36.8  Temperature Source oral  Pulse Pulse 76  Respirations Respirations 20  Systolic BP Systolic BP 295  Diastolic BP (mmHg) Diastolic BP (mmHg) 84  Mean BP 108  Pulse Ox % Pulse Ox % 98  Pulse Ox Activity Level  At rest  Oxygen Delivery Room Air/ 21 %   Physical Exam: General: NAD, nl weight  HEENT: normocephalic, sclera nonicteric, oropharynx clear  Neck: supple, no JVD, no bruits  Chest: CTA B, no wheezing, good movement  Cardiac: RRR, no murmurs, no edema, 2+ pulses  Extremities: no C/C/E, FROM   Neurologic Exam: Mental Status: alert but oriented only to person and place, follows simple commands, mild bradyphrenia  Cranial Nerves: PERRLA, EOMI, nl VF, face symmetric with hypomimia, tongue midline, shoulder shrug equal  Motor Exam: 5-/5 B, increased tone, trace L arm tremor, mild bradykinesia, marked retropulsion noted  Deep Tendon Reflexes: 1/4 B, mute plantars  Sensory Exam: pinprick, temperature, and vibration intact B  Coordination: shuffling gait with stupped posture, F to N wnl   Lab Results:  Routine Chem:  12-Nov-15 03:26  Glucose, Serum  100  BUN  22  Creatinine (comp) 1.23  Sodium, Serum 141  Potassium, Serum 4.0  Chloride, Serum  109  CO2, Serum 28  Calcium (Total), Serum  7.9  Anion Gap  4  Osmolality (calc) 285  eGFR (African American) >60  eGFR (Non-African American)  59 (eGFR values <58m/min/1.73 m2 may be an indication of chronic kidney disease (CKD). Calculated eGFR, using the MRDR Study equation, is useful in  patients with stable renal function. The eGFR calculation will not be reliable in acutely ill patients when serum creatinine is changing rapidly. It is not useful in patients on dialysis. The eGFR  calculation may not be applicable to patients at the low and high extremes of body sizes, pregnant women, and vegetarians.)  Cardiac:  10-Nov-15 13:19   Troponin I < 0.02 (0.00-0.05 0.05 ng/mL or less: NEGATIVE  Repeat testing in 3-6 hrs  if clinically indicated. >0.05 ng/mL: POTENTIAL  MYOCARDIAL INJURY. Repeat  testing in 3-6 hrs if  clinically indicated. NOTE: An increase or decrease  of 30% or more on serial  testing suggests a  clinically important change)  12-Nov-15 03:26   CK, Total  472 (39-308 NOTE: NEW REFERENCE RANGE  07/02/2013)  Routine UA:  10-Nov-15 17:30   Color (UA) Yellow  Clarity (UA) Clear  Glucose (UA) Negative  Bilirubin (UA) Negative  Ketones (UA) Negative  Specific Gravity (UA) 1.014  Blood (UA) Negative  pH (UA) 6.0  Protein (UA) Negative  Nitrite (UA) Negative  Leukocyte Esterase (UA) Negative (Result(s) reported on 09 Apr 2014 at 06:33PM.)  RBC (UA) 3 /HPF  WBC (UA) <1 /HPF  Bacteria (UA) NONE SEEN  Epithelial Cells (UA) <1 /HPF (Result(s) reported on 09 Apr 2014 at 06:33PM.)  Routine Hem:  12-Nov-15 03:26   WBC (CBC)  10.7  RBC (CBC)  4.09  Hemoglobin (CBC)  12.6  Hematocrit (CBC)  38.1  Platelet Count (CBC) 177  MCV 93  MCH 30.8  MCHC 33.0  RDW 14.4  Neutrophil % 45.1  Lymphocyte % 44.4  Monocyte % 8.8  Eosinophil % 1.3  Basophil % 0.4  Neutrophil # 4.8  Lymphocyte #  4.8  Monocyte # 0.9  Eosinophil # 0.1  Basophil # 0.0 (Result(s) reported on 11 Apr 2014 at 04:27AM.)   Radiology Results: CT:    10-Nov-15 17:55, CT Head Without Contrast  CT Head Without Contrast   REASON FOR EXAM:    Weaknes, Difficulty Walking.  COMMENTS:       PROCEDURE: CT  - CT HEAD WITHOUT CONTRAST  - Apr 09 2014  5:55PM     CLINICAL DATA:  Weakness, dizziness, no trauma    EXAM:  CT HEAD WITHOUT CONTRAST    TECHNIQUE:  Contiguous axial images were obtained from the base of the skull  through the vertex without intravenous  contrast.    COMPARISON:  03/01/2011  FINDINGS:  There is no evidence of mass effect, midline shift, or extra-axial  fluid collections. There is no evidence of a space-occupying lesion  or intracranial hemorrhage. There is no evidence of a cortical-based  area of acute infarction. There is a small old left basal ganglia  lacunar infarct. There is generalized cerebral atrophy. There is  periventricular white matter low attenuation likely secondary to  microangiopathy.    The ventricles and sulci are appropriate for the patient's age. The  basal cisterns are patent.    Visualized portions of the orbits are unremarkable. The visualized  portions  of the paranasal sinuses and mastoid air cells are  unremarkable. Cerebrovascular atherosclerotic calcifications are  noted.    The osseous structures are unremarkable.     IMPRESSION:  No acute intracranial pathology.      Electronically Signed    By: Kathreen Devoid    On: 04/09/2014 18:30         Verified By: Jennette Banker, M.D., MD    11-Nov-15 20:20, CT Head Without Contrast  CT Head Without Contrast   REASON FOR EXAM:    slurred speech  COMMENTS:       PROCEDURE: CT  - CT HEAD WITHOUT CONTRAST  - Apr 10 2014  8:20PM     CLINICAL DATA:  Altered mental status. Increased lethargy, and  slurred speech.    EXAM:  CT HEAD WITHOUT CONTRAST    TECHNIQUE:  Contiguous axial images were obtained from the base of the skull  through the vertex without intravenous contrast.  COMPARISON:  CT head without contrast 04/09/2014 and 03/01/2011.    FINDINGS:  Remote lacunar infarcts of the basal ganglia bilaterally are stable.  Moderate generalized atrophy and white matter disease is similar to  the prior studies. There is a remote lacunar in scratch the there is  a remote infarct involving the right insular cortex. The ventricles  are proportionate to the degree of atrophy. No acute cortical  infarct, hemorrhage, or mass lesion is  present. No significant  extraaxial fluid collection is present.    The paranasal sinuses and mastoid air cells are clear. The osseous  skull is intact.     IMPRESSION:  1. Stable atrophy and white matter disease.  2. Stable remote lacunar infarcts of the basal ganglia bilaterally.  3. 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      Electronically Signed    By: Lawrence Santiago M.D.    On: 04/10/2014 20:26         Verified By: Resa Miner. MATTERN, M.D.,   Radiology Impression: Radiology Impression: CT of head personally reviewed by me and shows moderate atrophy, trace white matter changes   Impression/Recommendations: Recommendations:   prior notes reviewed by me reviewed by me   Mild to moderate Parkinsonism-  this likely cause of inability to get up and some mild generalized weakness;  this appears to be primary so requires a trial of medication;  could also be part of Lewy Body dementia Dementia-  moderate but pt functions beyond this;  etiology could be multi-infarct, Lewy Body or other cause Elevated CPK-  not high enough to explain weakness trial of Sinemet 25/159m TID recheck CPK and ensure its decreasing needs PT evaluation pt should go home tomorrow  Electronic Signatures: SJamison Neighbor(MD)  (Signed [id number])  Authored: REFERRING PHYSICIAN, Primary Care Physician, Consult, History of Present Illness, Review of Systems, PAST MEDICAL/SURGICAL HISTORY, HOME MEDICATIONS, ALLERGIES, Social/Family History, NURSING VITAL SIGNS, Physical Exam-, LAB RESULTS, RADIOLOGY RESULTS, Recommendations   Last Updated: 12-Nov-15 22:40 by SJamison Neighbor(MD)

## 2014-09-21 NOTE — Discharge Summary (Signed)
PATIENT NAME:  William Ryan, William Ryan MR#:  [id number] DATE OF BIRTH:  March 17, 1922  DATE OF ADMISSION:  04/09/2014 DATE OF DISCHARGE:  04/12/2014  DISCHARGE/TRANSFER SUMMARY  TYPE OF DISCHARGE: Patient transferred to skilled nursing facility.   REASON FOR ADMISSION: Lethargy and weakness.   HISTORY OF PRESENT ILLNESS: The patient is an 81 year old male with a history of paroxysmal atrial fibrillation on Pradaxa. He also has a significant history of pacemaker implant, benign hypertension, and prostate cancer. He presented to the Emergency Room with generalized weakness, unable to get up off the toilet. CPK was elevated, consistent with rhabdomyolysis, and he is admitted for further evaluation.   PAST MEDICAL HISTORY:  1.  Paroxysmal atrial fibrillation on Pradaxa.  2.  Sick sinus syndrome, status post pacemaker implant.  3.  Benign hypertension.  4.  Prostate cancer, status post prostatectomy.  5.  Urinary incontinence.  6.  Hyperlipidemia.   MEDICATIONS ON ADMISSION: Please see admission note.   ALLERGIES: No known drug allergies.   SOCIAL HISTORY: The patient is a former smoker, but not recently. No alcohol abuse. He is married.   FAMILY HISTORY: Positive for ovarian cancer and lung cancer.   REVIEW OF SYSTEMS: As per HPI.   PHYSICAL EXAMINATION:  GENERAL: The patient was in no acute distress.  VITAL SIGNS: Stable, and he was afebrile.  HEENT: Unremarkable.  NECK: Supple, without JVD.  LUNGS: Clear.  CARDIAC: Regular rate and rhythm, with a normal S1, S2.  ABDOMEN: Soft and nontender.  EXTREMITIES: Without edema.  NEUROLOGIC: Grossly nonfocal, other than some global weakness, left greater than right.   HOSPITAL COURSE: The patient was admitted with generalized weakness and mild rhabdomyolysis. Head CT x 2 were negative for stroke. MRI was not done because of his pacemaker. He was seen in consultation by physical therapy, who noticed severe weakness and gait instability. Inpatient  rehab at a skilled nursing facility was recommended. He was seen by neurology in consultation who felt that the patient had Parkinson's disease and was started on Sinemet. His condition stabilized. His CPK improved. He is now transferred to the skilled nursing facility for further care and rehabilitation.   DISCHARGE DIAGNOSES:  1.  Parkinson syndrome.  2.  Paroxysmal atrial fibrillation.  3.  Prostate cancer, status post prostatectomy.  4.  Sick sinus syndrome, status post pacemaker implant.  5.  Chronic anemia.  6.  Dementia.  7.  Benign hypertension.  8.  Urinary incontinence.   DISCHARGE MEDICATIONS:  1.  Aspirin 81 mg p.o. daily.  2.  Pradaxa 75 mg p.o. b.i.d.  3.  Losartan 50 mg p.o. daily.  4.  Detrol LA 4 mg p.o. daily.  5.  Sinemet 25/100 mg 1 p.o. t.i.d. with meals.   FOLLOW-UP PLANS AND APPOINTMENTS: The patient will be discharged to a skilled nursing facility, and will be followed by the resident physician there. He will be seen in consultation by physical therapy. CBC and a MET-B in 1 week. Needs an appointment with me at the [facility] in 1 to 2 weeks, sooner if needed. He is on a regular diet.    ____________________________ Leonie Douglas. Doy Hutching, MD jds:MT D: 04/12/2014 08:37:00 ET T: 04/12/2014 09:25:19 ET JOB#: [id number]  cc: Leonie Douglas. Doy Hutching, MD, <Dictator> Lucie Friedlander Lennice Sites MD ELECTRONICALLY SIGNED 04/12/2014 14:00

## 2014-09-21 NOTE — H&P (Signed)
PATIENT NAME:  William Ryan, William Ryan MR#:  [id number] DATE OF BIRTH:  1921/11/03  DATE OF ADMISSION:  04/09/2014  PRIMARY CARE PHYSICIAN:  Idelle Crouch, MD   CHIEF COMPLAINT: Weakness and difficulty ambulating.   HISTORY OF PRESENT ILLNESS: This is an 81 year old male who was brought into the hospital by his son due to generalized weakness and difficulty ambulating. As per the son, the patient went to the toilet last night was unable to get up from the toilet and stayed there all night until he arrived. The patient was unable to walk at home; therefore, he was brought to the Emergency Room. In the Emergency Room, the ER physician, after giving some IV fluids, also attempted to ambulate, but he was still unable to do so. As per the son, patient has also been more altered than usual. He apparently went church this past Sunday and was more oriented this past weekend as compared to today. Given his generalized weakness, and his worsening mental status, hospital services were contacted for treatment and evaluation.   REVIEW OF SYSTEMS:   CONSTITUTIONAL: No documented fever. No weight gain or weight loss.  EYES: No blurred or double vision.  ENT: No tinnitus. No postnasal drip. No evidence of the oropharynx.  RESPIRATORY: No cough, no wheeze, no hemoptysis, no dyspnea.  CARDIOVASCULAR: No chest pain, no orthopnea, palpitation or syncope.  GASTROINTESTINAL: No nausea, vomiting, diarrhea. No abdominal pain. No melena or hematochezia.  GENITOURINARY: No dysuria or hematuria.  ENDOCRINE: No polyuria or nocturia, heat or cold intolerance.  HEMATOLOGIC: No anemia, no bruising, no bleeding.  INTEGUMENTARY: No rashes or lesions.  MUSCULOSKELETAL: No arthritis. No swelling. No gout.  NEUROLOGIC: No numbness, tingling. No ataxia. No seizure activity.  PSYCHIATRIC: No anxiety. No insomnia, No ADD.     PAST MEDICAL HISTORY: Consistent with chronic atrial fibrillation, status post pacemaker, hypertension, history  of prostate cancer, status post prostatectomy, hyperlipidemia, urinary incontinence.   ALLERGIES: No known drug allergies.   SOCIAL HISTORY: Used to be a smoker, quit many, many years ago, no alcohol abuse, no illicit drug abuse, lives at home with his wife.   FAMILY HISTORY: Mother and father are both deceased; mother died from ovarian cancer; father died from lung cancer.   CURRENT MEDICATIONS: As follows, aspirin 81 mg daily, Pradaxa 75 mg b.i.d., losartan 50 mg daily, and Detrol LA 4 mg daily.   PHYSICAL EXAMINATION:  VITAL SIGNS: Presently is as follows, are noted to be temperature 97.9, pulse 75, respirations 17, blood pressure 126/71, sat 99% on room air.  GENERAL: The patient is a pleasant-appearing male, lethargic but in no apparent distress.  HEAD AND EYES AND EARS AND NOSE AND THROAT: Atraumatic, normocephalic, extraocular muscles are intact, pupils equal and reactive to light, sclerae anicteric, no conjunctival injection, no pharyngeal erythema.  NECK: Supple. There is no jugular venous distention. No bruits, no lymphadenopathy, no thyromegaly.  HEART: Regular rate and rhythm, no murmurs, no rubs or clicks.  LUNGS: Clear to auscultation bilaterally, no rales or rhonchi, no wheezes.  ABDOMEN: Soft, flat, nontender, nondistended. Has good bowel sounds. No hepatosplenomegaly appreciated.  EXTREMITIES: No evidence of any cyanosis, clubbing, or peripheral edema, +2 pedal and radial pulses bilaterally.  NEUROLOGICAL: The patient is alert, awake, and oriented x 1, globally weak, no other focal motor or sensory defecits bilaterally.  SKIN: Moist and warm with no rashes appreciated.  LYMPHATIC: There is no cervical lymphadenopathy.   DIAGNOSTIC DATA: Serum glucose of 134, BUN 18, creatinine 1.15,  sodium 140, potassium 4.1, chloride 104, bicarbonate 28, total CK is 367, troponin less than 0.02, white cell count 10, hemoglobin 13.9, hematocrit 42.7, platelet count 199,000; the patient did  have a chest x-ray done which showed no evidence of acute cardiopulmonary disease.   ASSESSMENT AND PLAN: An 81 year old male with history of lymphoma, chronic atrial fibrillation, status post pacemaker, hypertension, history of prostate cancer, status post prostatectomy presents to the hospital due to generalized weakness. The patient was unable to get up from the toilet and remained there all night until was brought here by his son this morning.  Problem:  1.  Generalized weakness. Exact etiology of this is unclear, questionable due deconditioning versus underlying mild dementia. There is no evidence of any acute metabolic or infectious source to explain his weakness. I will get a CT head to rule out a neurogenic source, but my suspicion for a stroke is low. We will also get a physical therapy consult to assess his mobility.  2.  Altered mental status, etiology unclear, probably related to possible underlying mild dementia. No evidence of any acute infectious or metabolic source. We will get a physical therapy consult and CT  scan of his head, follow mental status closely.  3.  Hypertension presently hemodynamically stable. Continue  losartan.  4.  Chronic atrial fibrillation. The patient is rate controlled. He is status post pacemaker. We will continue his Pradaxa.  5.  Urinary incontinence. Continue tolterodine.   CODE STATUS: The patient is a full code. The patient will be transferred to Dr. Stacie Glaze service.   TIME SPENT:  Was 45 minutes.    ____________________________ Belia Heman. Verdell Carmine, MD vjs:nt D: 04/09/2014 17:39:33 ET T: 04/09/2014 17:55:55 ET JOB#: [id number]  cc: Belia Heman. Verdell Carmine, MD, <Dictator> Henreitta Leber MD ELECTRONICALLY SIGNED 04/19/2014 12:43

## 2014-12-24 ENCOUNTER — Other Ambulatory Visit: Payer: Self-pay

## 2014-12-24 ENCOUNTER — Ambulatory Visit: Payer: Self-pay | Admitting: Internal Medicine

## 2015-01-05 ENCOUNTER — Other Ambulatory Visit: Payer: Self-pay | Admitting: *Deleted

## 2015-01-05 DIAGNOSIS — C8510 Unspecified B-cell lymphoma, unspecified site: Secondary | ICD-10-CM

## 2015-01-06 ENCOUNTER — Inpatient Hospital Stay: Payer: Medicare Other | Attending: Internal Medicine

## 2015-01-06 ENCOUNTER — Inpatient Hospital Stay (HOSPITAL_BASED_OUTPATIENT_CLINIC_OR_DEPARTMENT_OTHER): Payer: Medicare Other | Admitting: Internal Medicine

## 2015-01-06 VITALS — BP 137/84 | HR 93 | Temp 97.5°F | Resp 18 | Ht 72.0 in | Wt 211.4 lb

## 2015-01-06 DIAGNOSIS — Z950 Presence of cardiac pacemaker: Secondary | ICD-10-CM

## 2015-01-06 DIAGNOSIS — Z8673 Personal history of transient ischemic attack (TIA), and cerebral infarction without residual deficits: Secondary | ICD-10-CM | POA: Insufficient documentation

## 2015-01-06 DIAGNOSIS — E785 Hyperlipidemia, unspecified: Secondary | ICD-10-CM

## 2015-01-06 DIAGNOSIS — Z7982 Long term (current) use of aspirin: Secondary | ICD-10-CM | POA: Diagnosis not present

## 2015-01-06 DIAGNOSIS — Z79899 Other long term (current) drug therapy: Secondary | ICD-10-CM | POA: Diagnosis not present

## 2015-01-06 DIAGNOSIS — I4891 Unspecified atrial fibrillation: Secondary | ICD-10-CM | POA: Diagnosis not present

## 2015-01-06 DIAGNOSIS — C8308 Small cell B-cell lymphoma, lymph nodes of multiple sites: Secondary | ICD-10-CM | POA: Insufficient documentation

## 2015-01-06 DIAGNOSIS — I10 Essential (primary) hypertension: Secondary | ICD-10-CM | POA: Diagnosis not present

## 2015-01-06 DIAGNOSIS — D649 Anemia, unspecified: Secondary | ICD-10-CM | POA: Insufficient documentation

## 2015-01-06 DIAGNOSIS — Z8546 Personal history of malignant neoplasm of prostate: Secondary | ICD-10-CM | POA: Insufficient documentation

## 2015-01-06 DIAGNOSIS — I7090 Unspecified atherosclerosis: Secondary | ICD-10-CM

## 2015-01-06 DIAGNOSIS — D6862 Lupus anticoagulant syndrome: Secondary | ICD-10-CM

## 2015-01-06 DIAGNOSIS — C8510 Unspecified B-cell lymphoma, unspecified site: Secondary | ICD-10-CM

## 2015-01-06 DIAGNOSIS — C8590 Non-Hodgkin lymphoma, unspecified, unspecified site: Secondary | ICD-10-CM

## 2015-01-06 LAB — CBC WITH DIFFERENTIAL/PLATELET
Basophils Absolute: 0 10*3/uL (ref 0–0.1)
Basophils Relative: 1 %
EOS ABS: 0.1 10*3/uL (ref 0–0.7)
EOS PCT: 1 %
HCT: 37.4 % — ABNORMAL LOW (ref 40.0–52.0)
Hemoglobin: 12.3 g/dL — ABNORMAL LOW (ref 13.0–18.0)
LYMPHS ABS: 2.9 10*3/uL (ref 1.0–3.6)
LYMPHS PCT: 40 %
MCH: 29 pg (ref 26.0–34.0)
MCHC: 32.9 g/dL (ref 32.0–36.0)
MCV: 88.2 fL (ref 80.0–100.0)
MONO ABS: 0.7 10*3/uL (ref 0.2–1.0)
Monocytes Relative: 9 %
NEUTROS ABS: 3.5 10*3/uL (ref 1.4–6.5)
Neutrophils Relative %: 49 %
PLATELETS: 192 10*3/uL (ref 150–440)
RBC: 4.24 MIL/uL — AB (ref 4.40–5.90)
RDW: 14.7 % — ABNORMAL HIGH (ref 11.5–14.5)
WBC: 7.2 10*3/uL (ref 3.8–10.6)

## 2015-01-06 LAB — CREATININE, SERUM
Creatinine, Ser: 1.26 mg/dL — ABNORMAL HIGH (ref 0.61–1.24)
GFR calc Af Amer: 56 mL/min — ABNORMAL LOW (ref >60)
GFR calc non Af Amer: 48 mL/min — ABNORMAL LOW (ref >60)

## 2015-01-06 NOTE — Progress Notes (Signed)
Patient states that overall he has been feeling pretty good. He states that the only pain he has is in his knee. He appetite has been good.

## 2015-01-18 NOTE — Progress Notes (Signed)
[name]  Telephone:([phone]) [phone] Fax:(336) [phone]     ID: William Ryan OB: May 12, 1922  MR#: [id number]  ERX#:[id number]  Patient Care Team: Idelle Crouch, MD as PCP - General (Internal Medicine)  CHIEF COMPLAINT/DIAGNOSIS:  Stage IV B-cell lymphoma  (Positive for CD20, CD19, CD45, lambda. Negative for CD5, CD10, CD38)  diagnosed on 06/03/11 by EGD/EUS FNA of perigastric mass found on CT scan of 03/01/11.  Stage IV given perigastric mass, small mediastinal lymph nodes on PET/CT scan, and Bone marrow biopsy study on 06/15/11 showing low level marrow involvement by CD5+ mature small B-cell lymphoma consistent with a variant B-cell CLL/SLL accounting for ~5-10% on marrow nucleated cells (CD38 positive, ZAP-70 positive).  Cytogenetics normal.  On surveillance.    HISTORY OF PRESENT ILLNESS:  Patient returns for continued oncology evaluation for history of lymphoma as described above, he was seen about 6-7 months ago. Overall strategy is doing study. Appetite is good, denies unintentional weight loss.  Denies any recurrent abdominal pain, nausea or vomiting. No diarrhea or blood in stools. States that chronic arthritis unchanged, remains physically active. Denies fevers or major night sweats. Denies feeling any large lymph nodes on self exam. No new bone pains.Marland Kitchen   REVIEW OF SYSTEMS:   ROS As in HPI above. In addition, no new headaches or focal weakness.  No new sore throat, cough, shortness of breath, sputum, hemoptysis or chest pain. No dizziness or palpitation. No abdominal pain, constipation, diarrhea, dysuria or hematuria. No new skin rash or bleeding symptoms. No new paresthesias in extremities. PS ECOG 1.  PAST MEDICAL HISTORY: Reviewed. Past Medical History  Diagnosis Date  . HLD (hyperlipidemia)   . HTN (hypertension)   . Sick sinus syndrome   . Prostate cancer   . Urinary incontinence   . A-fib           Hypertension  Hyperlipidemia  History of atrial  fibrillation on anticoagulation  Sick sinus syndrome status post pacemaker implant knee history of peptic ulcer disease  History of colon polyps  Osteoarthritis  Prostate cancer status post prostatectomy reportedly in remission  Environmental allergies  Vestibular dysfunction  History of CVA, denies residual weakness  Tremor  Onychomycosis  History of left rotator cuff tear  Hemorrhoidectomy  Appendectomy  Pacemaker implantation with replacement January 2009  PAST SURGICAL HISTORY: Reviewed. Past Surgical History  Procedure Laterality Date  . Prostatectomy      FAMILY HISTORY: Reviewed. Remarkable for history of ovarian cancer in his mother, otherwise denies malignancy or hematological disorders.   SOCIAL HISTORY: Reviewed Denies smoking or alcohol usage.  Remains fairly active and ambulatory for his age and medical problems.  No Known Allergies  Current Outpatient Prescriptions  Medication Sig Dispense Refill  . aspirin 81 MG tablet Take 81 mg by mouth daily.    . dabigatran (PRADAXA) 75 MG CAPS capsule Take 75 mg by mouth 2 (two) times daily.    . pantoprazole (PROTONIX) 40 MG tablet Take 40 mg by mouth daily.    Marland Kitchen tolterodine (DETROL LA) 4 MG 24 hr capsule Take 4 mg by mouth daily.    . carbidopa-levodopa (SINEMET IR) 25-100 MG per tablet Take 1 tablet by mouth 3 (three) times daily.    Marland Kitchen losartan (COZAAR) 50 MG tablet Take 50 mg by mouth daily.     No current facility-administered medications for this visit.    PHYSICAL EXAM: Filed Vitals:   01/06/15 1106  BP: 137/84  Pulse: 93  Temp: 97.5  F (36.4 C)  Resp: 18     Body mass index is 28.67 kg/(m^2).      GENERAL: Patient is alert and oriented and in no acute distress. There is no icterus. HEENT: EOMs intact. No cervical lymphadenopathy. CVS: S1S2, regular LUNGS: Bilaterally clear to auscultation, no rhonchi. ABDOMEN: Soft, nontender. No hepatosplenomegaly or masses palpable clinically.  EXTREMITIES: No  pedal edema. LYMPHATICS: No palpable adenopathy in axillary or inguinal areas.   LAB RESULTS:    Component Value Date/Time   NA 148* 04/12/2014 0334   NA 148* 04/12/2014   K 3.9 04/12/2014 0334   K 3.9 04/12/2014   CL 111* 04/12/2014 0334   CO2 26 04/12/2014 0334   GLUCOSE 94 04/12/2014 0334   BUN 19* 04/12/2014 0334   BUN 19 04/12/2014   CREATININE 1.26* 01/06/2015 1036   CREATININE 1.23 06/28/2014 1101   CREATININE 1.2 04/12/2014   CALCIUM 7.5* 04/12/2014 0334   PROT 7.0 06/15/2011 1256   ALBUMIN 3.6 06/15/2011 1256   AST 25 06/15/2011 1256   ALT 26 06/15/2011 1256   ALKPHOS 68 06/15/2011 1256   BILITOT 0.4 06/15/2011 1256   GFRNONAA 48* 01/06/2015 1036   GFRNONAA 59* 06/28/2014 1101   GFRNONAA 45* 12/28/2013 0956   GFRAA 56* 01/06/2015 1036   GFRAA >60 06/28/2014 1101   GFRAA 52* 12/28/2013 0956    Lab Results  Component Value Date   WBC 7.2 01/06/2015   NEUTROABS 3.5 01/06/2015   HGB 12.3* 01/06/2015   HCT 37.4* 01/06/2015   MCV 88.2 01/06/2015   PLT 192 01/06/2015    STUDIES: 12/26/13 - CT chest/abdomen/pelvis. IMPRESSION: 1. No acute findings within the chest abdomen or pelvis. 2. No evidence for residual or recurrence of tumor.  3. Borderline gastrohepatic ligament lymph node is unchanged. 4. Atherosclerotic disease.    ASSESSMENT / PLAN:   1. Stage IV B-cell lymphoma diagnosed on 06/03/11 by EGD/EUS FNA of perigastric mass (stage IV given perigastric mass, small mediastinal lymph nodes on PET/CT scan, and Bone marrow biopsy study on 06/15/11 showing low level marrow involvement by CD5+ mature small B-cell lymphoma consistent with a variant B-cell CLL/SLL accounting for ~5-10% on marrow nucleated cells, CD38 positive, ZAP-70 positive -  reviewed labs from today and d/w patient and family. Patient overall seems to be doing steady, he does not have major B symptoms. No progressive lymphadenopathy or hepatosplenomegaly on exam. Last CT scan in July 2015 did not  report progressive lymphadenopathy, borderline gastrohepatic lymph node was unchanged. Patient continues to follow with GI for upper GI symptoms. Patient prefers to hold off on getting radiologic evaluation unless he develops new symptoms or lab abnormalities. Plan therefore is continued clinical surveillance, we will get labs at 24 weeks, otherwise we will see him back at 48 weeks with repeat CBC/differential, creatinine, and LDH level.  2. Anemia - Mild, no symptoms referable to this. Continue to monitor.   3. h/o prolonged aPTT -  does not have bleeding or clotting issues.  aPTT 1:1 mixing study did not correct.  Lupus anticoagulant test was reported positive, however, patient is on Pradaxa which might be affecting this result also.  He does not have history of thromboembolic phenomena. 4. In between visits, the patient has been advised to call or come to the ER in case of fevers, unintentional weight loss, bleeding or acute sickness. He is agreeable to above plan.     Leia Alf, MD   01/18/2015 10:54 AM

## 2015-06-23 ENCOUNTER — Inpatient Hospital Stay: Payer: Medicare Other | Attending: Internal Medicine | Admitting: *Deleted

## 2015-06-23 DIAGNOSIS — C8308 Small cell B-cell lymphoma, lymph nodes of multiple sites: Secondary | ICD-10-CM | POA: Diagnosis present

## 2015-06-23 DIAGNOSIS — C8580 Other specified types of non-Hodgkin lymphoma, unspecified site: Secondary | ICD-10-CM

## 2015-06-23 LAB — LACTATE DEHYDROGENASE: LDH: 168 U/L (ref 98–192)

## 2015-06-23 LAB — CBC WITH DIFFERENTIAL/PLATELET
Basophils Absolute: 0.1 10*3/uL (ref 0–0.1)
Basophils Relative: 1 %
Eosinophils Absolute: 0.1 10*3/uL (ref 0–0.7)
Eosinophils Relative: 1 %
HCT: 39.1 % — ABNORMAL LOW (ref 40.0–52.0)
HEMOGLOBIN: 12.8 g/dL — AB (ref 13.0–18.0)
LYMPHS ABS: 2.9 10*3/uL (ref 1.0–3.6)
Lymphocytes Relative: 34 %
MCH: 29 pg (ref 26.0–34.0)
MCHC: 32.8 g/dL (ref 32.0–36.0)
MCV: 88.4 fL (ref 80.0–100.0)
MONOS PCT: 9 %
Monocytes Absolute: 0.7 10*3/uL (ref 0.2–1.0)
NEUTROS PCT: 55 %
Neutro Abs: 4.7 10*3/uL (ref 1.4–6.5)
Platelets: 219 10*3/uL (ref 150–440)
RBC: 4.42 MIL/uL (ref 4.40–5.90)
RDW: 14.7 % — ABNORMAL HIGH (ref 11.5–14.5)
WBC: 8.5 10*3/uL (ref 3.8–10.6)

## 2015-06-23 LAB — HEPATIC FUNCTION PANEL
ALBUMIN: 3.8 g/dL (ref 3.5–5.0)
ALK PHOS: 62 U/L (ref 38–126)
ALT: 18 U/L (ref 17–63)
AST: 23 U/L (ref 15–41)
Bilirubin, Direct: 0.1 mg/dL (ref 0.1–0.5)
Indirect Bilirubin: 0.5 mg/dL (ref 0.3–0.9)
TOTAL PROTEIN: 6.9 g/dL (ref 6.5–8.1)
Total Bilirubin: 0.6 mg/dL (ref 0.3–1.2)

## 2015-06-23 LAB — CREATININE, SERUM
CREATININE: 1.23 mg/dL (ref 0.61–1.24)
GFR calc Af Amer: 57 mL/min — ABNORMAL LOW (ref >60)
GFR calc non Af Amer: 49 mL/min — ABNORMAL LOW (ref >60)

## 2015-12-08 ENCOUNTER — Ambulatory Visit: Payer: Medicare Other | Admitting: Internal Medicine

## 2015-12-08 ENCOUNTER — Other Ambulatory Visit: Payer: Medicare Other

## 2018-02-07 ENCOUNTER — Other Ambulatory Visit: Payer: Self-pay

## 2018-02-07 ENCOUNTER — Encounter
Admission: RE | Admit: 2018-02-07 | Discharge: 2018-02-07 | Disposition: A | Discharge status: Home / Self Care | Payer: Medicare Other | Source: Ambulatory Visit | Attending: Cardiology | Admitting: Cardiology

## 2018-02-07 DIAGNOSIS — Z01818 Encounter for other preprocedural examination: Secondary | ICD-10-CM | POA: Diagnosis present

## 2018-02-07 DIAGNOSIS — I442 Atrioventricular block, complete: Secondary | ICD-10-CM | POA: Insufficient documentation

## 2018-02-07 DIAGNOSIS — Z950 Presence of cardiac pacemaker: Secondary | ICD-10-CM | POA: Insufficient documentation

## 2018-02-07 HISTORY — DX: Gastro-esophageal reflux disease without esophagitis: K21.9

## 2018-02-07 HISTORY — DX: Presence of cardiac pacemaker: Z95.0

## 2018-02-07 HISTORY — DX: Personal history of urinary calculi: Z87.442

## 2018-02-07 HISTORY — DX: Anemia, unspecified: D64.9

## 2018-02-07 LAB — SURGICAL PCR SCREEN
MRSA, PCR: NEGATIVE
STAPHYLOCOCCUS AUREUS: NEGATIVE

## 2018-02-07 NOTE — Patient Instructions (Signed)
Your procedure is scheduled on: Wed. 9/18 Report to Day Surgery. To find out your arrival time please call [phone] between 1PM - 3PM on Tues. 9/17.  Remember: Instructions that are not followed completely may result in serious medical risk,  up to and including death, or upon the discretion of your surgeon and anesthesiologist your  surgery may need to be rescheduled.     _X__ 1. Do not eat food after midnight the night before your procedure.                 No gum chewing or hard candies. You may drink clear liquids up to 2 hours                 before you are scheduled to arrive for your surgery- DO not drink clear                 liquids within 2 hours of the start of your surgery.                 Clear Liquids include:  water, apple juice without pulp, clear carbohydrate                 drink such as Clearfast of Gatorade, Black Coffee or Tea (Do not add                 anything to coffee or tea).  __X__2.  On the morning of surgery brush your teeth with toothpaste and water, you                may rinse your mouth with mouthwash if you wish.  Do not swallow any toothpaste of mouthwash.     ___ 3.  No Alcohol for 24 hours before or after surgery.   ___ 4.  Do Not Smoke or use e-cigarettes For 24 Hours Prior to Your Surgery.                 Do not use any chewable tobacco products for at least 6 hours prior to                 surgery.  ____  5.  Bring all medications with you on the day of surgery if instructed.   __x__  6.  Notify your doctor if there is any change in your medical condition      (cold, fever, infections).     Do not wear jewelry, make-up, hairpins, clips or nail polish. Do not wear lotions, powders, or perfumes. You may wear deodorant. Do not shave 48 hours prior to surgery. Men may shave face and neck. Do not bring valuables to the hospital.    [facility] is not responsible for any belongings or valuables.  Contacts, dentures  or bridgework may not be worn into surgery. Leave your suitcase in the car. After surgery it may be brought to your room. For patients admitted to the hospital, discharge time is determined by your treatment team.   Patients discharged the day of surgery will not be allowed to drive home.   Please read over the following fact sheets that you were given:    __x__ Take these medicines the morning of surgery with A SIP OF WATER:    1. acetaminophen (TYLENOL) 500 MG tablet if needed  2. pantoprazole (PROTONIX) 40 MG tablet  3.   4.  5.  6.  ____ Fleet Enema (as directed)   __x__ Use CHG Soap  as directed  ____ Use inhalers on the day of surgery  ____ Stop metformin 2 days prior to surgery    ____ Take 1/2 of usual insulin dose the night before surgery. No insulin the morning          of surgery.   _x__ Stop Plavix/aspirin  Last dose on Thurs. 8/12  ____ Stop Anti-inflammatories on    ____ Stop supplements until after surgery.    ____ Bring C-Pap to the hospital.

## 2018-02-14 ENCOUNTER — Encounter
Admission: RE | Admit: 2018-02-14 | Discharge: 2018-02-14 | Disposition: A | Discharge status: Home / Self Care | Payer: Medicare Other | Source: Ambulatory Visit | Attending: Cardiology | Admitting: Cardiology

## 2018-02-14 ENCOUNTER — Ambulatory Visit
Admission: RE | Admit: 2018-02-14 | Discharge: 2018-02-14 | Disposition: A | Discharge status: Home / Self Care | Payer: Medicare Other | Source: Ambulatory Visit | Attending: Cardiology | Admitting: Cardiology

## 2018-02-14 DIAGNOSIS — I4891 Unspecified atrial fibrillation: Secondary | ICD-10-CM | POA: Diagnosis not present

## 2018-02-14 DIAGNOSIS — Z45010 Encounter for checking and testing of cardiac pacemaker pulse generator [battery]: Secondary | ICD-10-CM

## 2018-02-14 LAB — BASIC METABOLIC PANEL
Anion gap: 7 (ref 5–15)
BUN: 20 mg/dL (ref 8–23)
CALCIUM: 9.1 mg/dL (ref 8.9–10.3)
CO2: 26 mmol/L (ref 22–32)
CREATININE: 1.13 mg/dL (ref 0.61–1.24)
Chloride: 105 mmol/L (ref 98–111)
GFR, EST NON AFRICAN AMERICAN: 54 mL/min — AB (ref >60)
Glucose, Bld: 104 mg/dL — ABNORMAL HIGH (ref 70–99)
Potassium: 4.7 mmol/L (ref 3.5–5.1)
SODIUM: 138 mmol/L (ref 135–145)

## 2018-02-14 LAB — PROTIME-INR
INR: 1.13
PROTHROMBIN TIME: 14.4 s (ref 11.4–15.2)

## 2018-02-14 LAB — APTT: APTT: 34 s (ref 24–36)

## 2018-02-15 ENCOUNTER — Other Ambulatory Visit: Payer: Self-pay

## 2018-02-15 ENCOUNTER — Ambulatory Visit: Payer: Medicare Other | Admitting: Certified Registered Nurse Anesthetist

## 2018-02-15 ENCOUNTER — Ambulatory Visit
Admission: RE | Admit: 2018-02-15 | Discharge: 2018-02-15 | Disposition: A | Discharge status: Home / Self Care | Payer: Medicare Other | Source: Ambulatory Visit | Attending: Cardiology | Admitting: Cardiology

## 2018-02-15 ENCOUNTER — Encounter: Payer: Self-pay | Admitting: *Deleted

## 2018-02-15 ENCOUNTER — Encounter
Admission: RE | Disposition: A | Discharge status: Home / Self Care | Payer: Self-pay | Source: Ambulatory Visit | Attending: Cardiology

## 2018-02-15 DIAGNOSIS — Z7901 Long term (current) use of anticoagulants: Secondary | ICD-10-CM | POA: Diagnosis not present

## 2018-02-15 DIAGNOSIS — E785 Hyperlipidemia, unspecified: Secondary | ICD-10-CM | POA: Diagnosis not present

## 2018-02-15 DIAGNOSIS — Z87891 Personal history of nicotine dependence: Secondary | ICD-10-CM | POA: Insufficient documentation

## 2018-02-15 DIAGNOSIS — Z8249 Family history of ischemic heart disease and other diseases of the circulatory system: Secondary | ICD-10-CM | POA: Diagnosis not present

## 2018-02-15 DIAGNOSIS — Z809 Family history of malignant neoplasm, unspecified: Secondary | ICD-10-CM | POA: Diagnosis not present

## 2018-02-15 DIAGNOSIS — R42 Dizziness and giddiness: Secondary | ICD-10-CM | POA: Insufficient documentation

## 2018-02-15 DIAGNOSIS — Z79899 Other long term (current) drug therapy: Secondary | ICD-10-CM | POA: Diagnosis not present

## 2018-02-15 DIAGNOSIS — Z8546 Personal history of malignant neoplasm of prostate: Secondary | ICD-10-CM | POA: Insufficient documentation

## 2018-02-15 DIAGNOSIS — M1990 Unspecified osteoarthritis, unspecified site: Secondary | ICD-10-CM | POA: Diagnosis not present

## 2018-02-15 DIAGNOSIS — Z833 Family history of diabetes mellitus: Secondary | ICD-10-CM | POA: Diagnosis not present

## 2018-02-15 DIAGNOSIS — Z8711 Personal history of peptic ulcer disease: Secondary | ICD-10-CM | POA: Diagnosis not present

## 2018-02-15 DIAGNOSIS — E1151 Type 2 diabetes mellitus with diabetic peripheral angiopathy without gangrene: Secondary | ICD-10-CM | POA: Insufficient documentation

## 2018-02-15 DIAGNOSIS — Z86010 Personal history of colonic polyps: Secondary | ICD-10-CM | POA: Insufficient documentation

## 2018-02-15 DIAGNOSIS — Z45018 Encounter for adjustment and management of other part of cardiac pacemaker: Secondary | ICD-10-CM | POA: Diagnosis not present

## 2018-02-15 DIAGNOSIS — I739 Peripheral vascular disease, unspecified: Secondary | ICD-10-CM | POA: Diagnosis not present

## 2018-02-15 DIAGNOSIS — Z7982 Long term (current) use of aspirin: Secondary | ICD-10-CM | POA: Diagnosis not present

## 2018-02-15 DIAGNOSIS — I495 Sick sinus syndrome: Secondary | ICD-10-CM | POA: Insufficient documentation

## 2018-02-15 DIAGNOSIS — Z8261 Family history of arthritis: Secondary | ICD-10-CM | POA: Diagnosis not present

## 2018-02-15 DIAGNOSIS — I10 Essential (primary) hypertension: Secondary | ICD-10-CM | POA: Diagnosis not present

## 2018-02-15 DIAGNOSIS — Z8673 Personal history of transient ischemic attack (TIA), and cerebral infarction without residual deficits: Secondary | ICD-10-CM | POA: Insufficient documentation

## 2018-02-15 DIAGNOSIS — I482 Chronic atrial fibrillation: Secondary | ICD-10-CM | POA: Diagnosis not present

## 2018-02-15 DIAGNOSIS — I6529 Occlusion and stenosis of unspecified carotid artery: Secondary | ICD-10-CM | POA: Diagnosis not present

## 2018-02-15 HISTORY — PX: PACEMAKER INSERTION: SHX728

## 2018-02-15 SURGERY — INSERTION, CARDIAC PACEMAKER
Anesthesia: General | Site: Chest | Laterality: Left

## 2018-02-15 MED ORDER — PHENYLEPHRINE HCL 10 MG/ML IJ SOLN
INTRAMUSCULAR | Status: DC | PRN
  Administered 2018-02-15 (×3): 100 ug via INTRAVENOUS

## 2018-02-15 MED ORDER — FENTANYL CITRATE (PF) 100 MCG/2ML IJ SOLN
INTRAMUSCULAR | Status: AC
  Filled 2018-02-15: qty 2

## 2018-02-15 MED ORDER — HYDROCORTISONE 1 % EX CREA
TOPICAL_CREAM | CUTANEOUS | Status: DC
  Administered 2018-02-15 (×2): via TOPICAL
  Filled 2018-02-15 (×2): qty 28

## 2018-02-15 MED ORDER — SUCCINYLCHOLINE CHLORIDE 20 MG/ML IJ SOLN
INTRAMUSCULAR | Status: AC
  Filled 2018-02-15: qty 1

## 2018-02-15 MED ORDER — LIDOCAINE HCL (CARDIAC) PF 100 MG/5ML IV SOSY
PREFILLED_SYRINGE | INTRAVENOUS | Status: DC | PRN
  Administered 2018-02-15: 40 mg via INTRAVENOUS

## 2018-02-15 MED ORDER — LACTATED RINGERS IV SOLN: INTRAVENOUS | Status: DC

## 2018-02-15 MED ORDER — SODIUM CHLORIDE 0.9 % IV SOLN
Freq: Once | INTRAVENOUS | Status: DC
  Filled 2018-02-15: qty 2

## 2018-02-15 MED ORDER — EPHEDRINE SULFATE 50 MG/ML IJ SOLN
INTRAMUSCULAR | Status: AC
  Filled 2018-02-15: qty 1

## 2018-02-15 MED ORDER — ACETAMINOPHEN 325 MG PO TABS: 325.0000 mg | ORAL_TABLET | ORAL | Status: DC | PRN

## 2018-02-15 MED ORDER — PROPOFOL 500 MG/50ML IV EMUL
INTRAVENOUS | Status: DC | PRN
  Administered 2018-02-15: 75 ug/kg/min via INTRAVENOUS

## 2018-02-15 MED ORDER — CEPHALEXIN 250 MG PO CAPS: 250.0000 mg | ORAL_CAPSULE | Freq: Four times a day (QID) | ORAL | 0 refills | Status: DC

## 2018-02-15 MED ORDER — PROPOFOL 10 MG/ML IV BOLUS
INTRAVENOUS | Status: AC
  Filled 2018-02-15: qty 20

## 2018-02-15 MED ORDER — FENTANYL CITRATE (PF) 100 MCG/2ML IJ SOLN
INTRAMUSCULAR | Status: DC | PRN
  Administered 2018-02-15 (×2): 25 ug via INTRAVENOUS

## 2018-02-15 MED ORDER — LACTATED RINGERS IV SOLN
INTRAVENOUS | Status: DC
  Administered 2018-02-15: 12:00:00 via INTRAVENOUS

## 2018-02-15 MED ORDER — FENTANYL CITRATE (PF) 100 MCG/2ML IJ SOLN: 25.0000 ug | INTRAMUSCULAR | Status: DC | PRN

## 2018-02-15 MED ORDER — ONDANSETRON HCL 4 MG/2ML IJ SOLN: 4.0000 mg | Freq: Four times a day (QID) | INTRAMUSCULAR | Status: DC | PRN

## 2018-02-15 MED ORDER — LIDOCAINE HCL (PF) 2 % IJ SOLN
INTRAMUSCULAR | Status: AC
  Filled 2018-02-15: qty 10

## 2018-02-15 MED ORDER — CEFAZOLIN SODIUM-DEXTROSE 1-4 GM/50ML-% IV SOLN
1.0000 g | Freq: Once | INTRAVENOUS | Status: AC
  Administered 2018-02-15: 1 g via INTRAVENOUS

## 2018-02-15 MED ORDER — SODIUM CHLORIDE 0.9 % IV SOLN
INTRAVENOUS | Status: DC | PRN
  Administered 2018-02-15: 200 mL

## 2018-02-15 MED ORDER — LIDOCAINE 1 % OPTIME INJ - NO CHARGE
INTRAMUSCULAR | Status: DC | PRN
  Administered 2018-02-15: 20 mL

## 2018-02-15 MED ORDER — CEFAZOLIN SODIUM-DEXTROSE 1-4 GM/50ML-% IV SOLN
INTRAVENOUS | Status: AC
  Filled 2018-02-15: qty 50

## 2018-02-15 MED ORDER — GENTAMICIN SULFATE 40 MG/ML IJ SOLN
INTRAMUSCULAR | Status: AC
  Filled 2018-02-15: qty 2

## 2018-02-15 MED ORDER — DEXTROSE 5 % IV SOLN
1000.0000 mg | Freq: Once | INTRAVENOUS | Status: DC
  Filled 2018-02-15: qty 10

## 2018-02-15 SURGICAL SUPPLY — 34 items
BAG DECANTER FOR FLEXI CONT (MISCELLANEOUS) ×3 IMPLANT
BRUSH SCRUB EZ  4% CHG (MISCELLANEOUS) ×2
BRUSH SCRUB EZ 4% CHG (MISCELLANEOUS) ×1 IMPLANT
CABLE SURG 12 DISP A/V CHANNEL (MISCELLANEOUS) ×3 IMPLANT
CANISTER SUCT 1200ML W/VALVE (MISCELLANEOUS) ×3 IMPLANT
CHLORAPREP W/TINT 26ML (MISCELLANEOUS) ×3 IMPLANT
COVER LIGHT HANDLE STERIS (MISCELLANEOUS) ×6 IMPLANT
COVER MAYO STAND STRL (DRAPES) ×3 IMPLANT
DRAPE C-ARM XRAY 36X54 (DRAPES) IMPLANT
DRSG TEGADERM 4X4.75 (GAUZE/BANDAGES/DRESSINGS) ×3 IMPLANT
DRSG TELFA 4X3 1S NADH ST (GAUZE/BANDAGES/DRESSINGS) ×3 IMPLANT
ELECT REM PT RETURN 9FT ADLT (ELECTROSURGICAL) ×3
ELECTRODE REM PT RTRN 9FT ADLT (ELECTROSURGICAL) ×1 IMPLANT
GLOVE BIO SURGEON STRL SZ7.5 (GLOVE) ×3 IMPLANT
GLOVE BIO SURGEON STRL SZ8 (GLOVE) ×3 IMPLANT
GOWN STRL REUS W/ TWL LRG LVL3 (GOWN DISPOSABLE) ×1 IMPLANT
GOWN STRL REUS W/ TWL XL LVL3 (GOWN DISPOSABLE) ×1 IMPLANT
GOWN STRL REUS W/TWL LRG LVL3 (GOWN DISPOSABLE) ×2
GOWN STRL REUS W/TWL XL LVL3 (GOWN DISPOSABLE) ×2
IMMOBILIZER SHDR MD LX WHT (SOFTGOODS) IMPLANT
IMMOBILIZER SHDR XL LX WHT (SOFTGOODS) IMPLANT
INTRO PACEMKR SHEATH II 7FR (MISCELLANEOUS) ×3
INTRODUCER PACEMKR SHTH II 7FR (MISCELLANEOUS) ×1 IMPLANT
IPG PACE AZUR XT SR MRI W1SR01 (Pacemaker) ×1 IMPLANT
IV NS 500ML (IV SOLUTION) ×2
IV NS 500ML BAXH (IV SOLUTION) ×1 IMPLANT
KIT TURNOVER KIT A (KITS) ×3 IMPLANT
KIT WRENCH (KITS) ×3 IMPLANT
LABEL OR SOLS (LABEL) ×3 IMPLANT
MARKER SKIN DUAL TIP RULER LAB (MISCELLANEOUS) ×3 IMPLANT
PACE AZURE XT SR MRI W1SR01 (Pacemaker) ×3 IMPLANT
PACK PACE INSERTION (MISCELLANEOUS) ×3 IMPLANT
PAD ONESTEP ZOLL R SERIES ADT (MISCELLANEOUS) ×3 IMPLANT
SUT SILK 0 SH 30 (SUTURE) ×9 IMPLANT

## 2018-02-15 NOTE — H&P (Signed)
Jump to Section ? Document InformationEncounter DetailsLast Filed Vital SignsPatient ContactsPatient DemographicsPlan of TreatmentProgress NotesReason for VisitSocial HistoryVisit Diagnoses [employer], generated on Sep. 18, 2019September 18, 2019 Printout Information  Document Contents Document Received Date Document Source Organization  Office Visit Sep. 18, 2019September 18, 2019 William Ryan  Patient Demographics - 84 y.o. Male; born [birth year]   Patient Address Communication Language Race / Ethnicity Marital Status  [place] [address] [place], [place] [address] [phone] [facility]) [phone] (Home) [email or web address] English (Preferred) White / Not Hispanic or Latino Widowed  Reason for Visit   Reason Comments  Atrial Fibrillation   Pacer-ICD ERI  Encounter Details   Date Type Department Care Team Description  01/23/2018 Office Visit [facility]  [place], [place] [address]  [phone]  Flossie Dibble, [place]  [facility]  [place], [place] [address]  [phone]  [phone] (Fax)  Chronic a-fib (CMS-HCC) (Primary Dx);  Sick sinus syndrome (CMS-HCC)  Social History - documented as of this encounter  Tobacco Use Types Packs/Day Years Used Date  Former Smoker Cigarettes     Smokeless Tobacco: Never Used      Alcohol Use Drinks/Week oz/Week Comments  No      Sex Assigned at [employer] Date Recorded  Not on file    Job Start Date Occupation Industry  Not on file Not on file Not on file   Travel History Travel Start Travel End  No recent travel history available.    Last Filed Vital Signs - documented in this encounter  Vital Sign Reading Time Taken Comments  Blood Pressure 120/72 01/23/2018 9:11 AM EDT   Pulse 65 01/23/2018 9:11 AM EDT   Temperature - -   Respiratory Rate - -    Oxygen Saturation 96% 01/23/2018 9:11 AM EDT   Inhaled Oxygen Concentration - -   Weight 91.2 kg (201 lb 1 oz) 01/23/2018 9:11 AM EDT   Height 180.3 cm (5\' 11" ) 01/23/2018 9:11 AM EDT   Body Mass Index 28.04 01/23/2018 9:11 AM EDT   Progress Notes - documented in this encounter  Flossie Dibble, MD - 01/23/2018 9:00 AM EDT Formatting of this note might be different from the original. Established Patient Visit   Chief Complaint: Chief Complaint  Patient presents with  . Atrial Fibrillation  . Pacer-ICD  ERI  Date of Service: 01/23/2018 Date of Birth: 1921/10/27 PCP: Idelle Crouch, MD  History of Present Illness: William Ryan is a 84 y.o.male patient  Sick sinus syndrome The patient has had chronic non valvular atrial fibrillation for years with pacemaker placement with symptoms including dyspnea, exertional chest pressure/discomfort and fatigue stable on medications including none. The patient has causes and risk factors of atrial fibrillation including hypertension, valve disease and structural heart disease and or dysfunction. The identified symptoms associated with atrial fibrillation have altered the patient's quality of life. The patient has been on anticoagulation for further risk reduction of cardiovascular event and/or stroke Pacemaker Interrogation The patient has had a pacemaker interrogation which has shown that the pacemaker battery has reached end of life. We have had further discussion of battery change out with all of the risks and benefits. Otherwise the patient has not had any significant side effects or symptoms of the pacemaker wires.  Past Medical and Surgical History  Past Medical History Past Medical History:  Diagnosis Date  . Atrial fibrillation (CMS-HCC)  .  Candidiasis  . Carotid atherosclerosis  . CVA (cerebral vascular accident) (CMS-HCC)  . Environmental allergies  . Esophageal stenosis  . History of colonic polyps  . Hyperlipidemia  .  Hypertension  . Onychomycosis  . Osteoarthritis  . Pacemaker  . Peripheral vascular disease (CMS-HCC)  . Prostate cancer (CMS-HCC)  s/p prostatectomy  . PUD (peptic ulcer disease)  . Sick sinus syndrome (CMS-HCC)  . Tremor  . Valvular heart disease  . Vestibular dizziness involving both inner ears   Past Surgical History He has a past surgical history that includes Insert / replace / remove pacemaker (11/1999); Left rotator cuff tear; Hemorrhoidectomy; Appendectomy; egd (04/07/2011); Prostate surgery; Colonoscopy (07/12/1994, 03/17/1998, 04/09/2003, 10/20/2004); and egd (10/20/2004, 02/11/2009, 06/03/2011).   Medications and Allergies  Current Medications  Current Outpatient Medications on File Prior to Visit  Medication Sig Dispense Refill  . acetaminophen (TYLENOL) 500 MG tablet Take 1,000 mg by mouth every morning.  Marland Kitchen apixaban (ELIQUIS) 2.5 mg tablet Take 1 tablet (2.5 mg total) by mouth every 12 (twelve) hours 180 tablet 3  . aspirin 81 MG EC tablet Take 81 mg by mouth once daily.  Marland Kitchen oxybutynin (DITROPAN) 5 mg tablet TAKE 1 TABLET BY MOUTH TWICE A DAY 180 tablet 1  . pantoprazole (PROTONIX) 40 MG DR tablet TAKE 1 TABLET BY MOUTH EVERY DAY 30 tablet 11  . polyethylene glycol (MIRALAX) powder Take 17 g by mouth as needed for Constipation. Mix in 4-8ounces of fluid prior to taking.   No current facility-administered medications on file prior to visit.   Allergies: Patient has no known allergies.  Social and Family History  Social History reports that he has quit smoking. His smoking use included cigarettes. He has never used smokeless tobacco. He reports that he does not drink alcohol or use drugs.  Family History Family History  Problem Relation Age of Onset  . Cancer Mother  . Myocardial Infarction (Heart attack) Father  . Myocardial Infarction (Heart attack) Son  . High blood pressure (Hypertension) Other  . Arthritis Other  . Diabetes type II Other   Review of Systems    Review of Systems  Positive for weankess fatigue sob Negative for weight gain weight loss, vision change, hearing loss, cough, congestion, PND, orthopnea, heartburn, nausea, diaphoresis, vomiting, diarrhea, bloody stool, melena, stomach pain, extremity pain, leg weakness, leg cramping, leg blood clots, headache, blackouts, nosebleed, trouble swallowing, mouth pain, urinary frequency, urination at night, muscle weakness, skin lesions, skin rashes, tingling ,ulcers, numbness, anxiety, and/or depression Physical Examination   Vitals:BP 120/72  Pulse 65  Ht 180.3 cm (5\' 11" )  Wt 91.2 kg (201 lb 1 oz)  SpO2 96%  BMI 28.04 kg/m  Ht:180.3 cm (5\' 11" ) Wt:91.2 kg (201 lb 1 oz) SWF:UXNA surface area is 2.14 meters squared. Body mass index is 28.04 kg/m. Appearance: well appearing in no acute distress HEENT: Pupils equally reactive to light and accomodation, no xanthalasma  Neck: Supple, no apparent thyromegaly, masses, or lymphadenopathy  Lungs: normal respiratory effort; no crackles, no rhonchi, no wheezes Heart: Regular rate and rhythm. Normal S1 S2 No gallops, murmur, no rub, PMI is normal size and placement. carotid upstroke normal without bruit. Jugular venous pressure is normal Abdomen: soft, nontender, not distended with normal bowel sounds. No apparent hepatosplenomegally. Abdominal aorta is normal size without bruit Extremities: no edema, no ulcers, no clubbing, no cyanosis Peripheral Pulses: 2+ in upper extremities, 2+ femoral pulses bilaterally, 2+lower extremity  Musculoskeletal; Normal muscle tone without kyphosis Neurological: Oriented and  Alert, Cranial nerves intact  Assessment   84 y.o. male with  Encounter Diagnoses  Name Primary?  . Chronic a-fib (CMS-HCC) Yes  . Sick sinus syndrome (CMS-HCC)   Plan  -the patient will have a consultation and or surgical treatment for recent pacemaker interrogation showing the battery to be end of life. The patient will need a pacemaker  battery change out. Patient understands all risks and benefits of battery change out. This includes the possibility of death, stroke, heart attack, infection, bleeding, blood clot, and or side effects of medication management for anesthesia and agrees to proceed -Proceed to surgery and/or invasive procedure without restriction. The patient is at the lowest risk possible for cardiovascular complication as stated above. Patient may discontinue anticoagulation 3 days prior to surgical intervention and or procedure for reduced bleeding risk. The patient shall restart at a safe period after procedure when bleeding risk is reduced. Patient understands all the risks and benefits of anticoagulation and discontinuation of anticoagulation perioperatively and the quoted study data that applies   No orders of the defined types were placed in this encounter.  Return in about 4 weeks (around 02/20/2018).  Flossie Dibble, MD    Electronically signed by Flossie Dibble, MD at 01/23/2018 9:29 AM EDT  Plan of Treatment - documented as of this encounter  Upcoming Encounters Upcoming Encounters  Date Type Specialty Care Team Description  02/16/2018 Office Visit Internal Medicine Idelle Crouch, MD  [address]  [facility] [place], [place] [address]  [phone]  [phone]    05/08/2018 Office Visit Cardiology Flossie Dibble, MD  [address]  [facility]  [place], [place] [address]  [phone]  [phone] (Fax)    Elinor Dodge, [place]  [place]  [facility]  [place], [place] [address]  [phone]  [phone] (Fax)    Visit Diagnoses - documented in this encounter  Diagnosis  Chronic a-fib (CMS-HCC) - Primary  Atrial fibrillation   Sick sinus syndrome (CMS-HCC)  Sinoatrial node dysfunction   Images Patient Contacts   Contact Name Contact Address Communication  Relationship to Patient  [name] Unknown [phone] [facility]) Son or Daughter, Emergency Contact  Document Information  Primary Care Provider Other Service Providers Document Coverage Dates  Idelle Crouch, MD (Mar. 11, 2015March 11, 2015 - Present) DM: [phone] (Work) [phone] (Fax) [place], [place] [address] Internal Medicine  Aug. 26, 2019August 26, 2019 - Aug. 30, 2019August 30, 2019   [place] [address] [place], [place] [address]   Encounter Providers Encounter Date  Flossie Dibble, MD (Attending) [phone] (Work) [phone] (Fax) [place] [facility] [place], [place] [address] Cardiovascular Disease Aug. 26, 2019August 26, 2019 - Aug. 30, 2019August 30, 2019    Show All Sections

## 2018-02-15 NOTE — Transfer of Care (Signed)
Immediate Anesthesia Transfer of Care Note  Patient: William Ryan  Procedure(s) Performed: CHANGE OUT SINGLE CHAMBER PACEMAKER (Left Chest)  Patient Location: PACU  Anesthesia Type:General  Level of Consciousness: drowsy  Airway & Oxygen Therapy: Patient Spontanous Breathing and Patient connected to face mask oxygen  Post-op Assessment: Report given to RN and Post -op Vital signs reviewed and stable  Post vital signs: Reviewed and stable  Last Vitals:  Vitals Value Taken Time  BP 153/72 02/15/2018  1:00 PM  Temp    Pulse 69 02/15/2018  1:02 PM  Resp 21 02/15/2018  1:02 PM  SpO2 98 % 02/15/2018  1:02 PM  Vitals shown include unvalidated device data.  Last Pain:  Vitals:   02/15/18 1109  TempSrc: Temporal  PainSc: 5          Complications: No apparent anesthesia complications

## 2018-02-15 NOTE — Progress Notes (Signed)
Monitor pulse 121 to 140  Radial pulse 80   Pulse oxymetry shows 73    Dr fitzgerald in to see pt  Says pt is fine

## 2018-02-15 NOTE — Anesthesia Procedure Notes (Signed)
Procedure Name: MAC Date/Time: 02/15/2018 12:10 PM Performed by: Rudean Hitt, CRNA Oxygen Delivery Method: Simple face mask

## 2018-02-15 NOTE — Op Note (Signed)
[facility] Cardiology   02/15/2018                     12:59 PM  PATIENT:  William Ryan    PRE-OPERATIVE DIAGNOSIS:  SICK SINUS SYNDROME, AFIB,ERI  POST-OPERATIVE DIAGNOSIS:  Same  PROCEDURE:  CHANGE OUT SINGLE CHAMBER PACEMAKER  SURGEON:  Isaias Cowman, MD    ANESTHESIA:     PREOPERATIVE INDICATIONS:  William Ryan is a  84 y.o. male with a diagnosis of SICK SINUS SYNDROME, AFIB,ERI who failed conservative measures and elected for surgical management.    The risks benefits and alternatives were discussed with the patient preoperatively including but not limited to the risks of infection, bleeding, cardiopulmonary complications, the need for revision surgery, among others, and the patient was willing to proceed.   OPERATIVE PROCEDURE: The patient was brought to the operating room in a fasting state.  The left pectoral region was prepped and draped in usual sterile manner.  Anesthesia was obtained 1% lidocaine locally.  A 6 cm incision was performed to the left pectoral region.  The old pacemaker generator was retrieved by electrocautery and blunt dissection.  The lead was disconnected and reconnected to a new single-chamber rate responsive pacemaker generator ( Medtronic [id number] ).  The pacemaker pocket was irrigated gentamicin solution.  The pacemaker generator was positioned into the pocket and the pocket was closed with 2-0 and 4-0 Vicryl, respectively.  Steri-Strips and pressure dressing were applied.  Procedure interrogation revealed appropriate ventricular sensing and pacing thresholds.  No periprocedural complications.

## 2018-02-15 NOTE — OR Nursing (Signed)
Hydrocortisone cream applied to lower neck and shoulders bilat on arrival to postop.   2nd c/o itching same area, wiped shoulders, chest with warm water, applied cream.   Denied itching at discharge time.

## 2018-02-15 NOTE — Discharge Instructions (Addendum)
May remove outer bandage on 02/16/2018.  Patient may shower on 02/16/2018.  Leave Steri-Strips on until follow-up visit.  Resume Eliquis on 02/17/2018.   AMBULATORY SURGERY  DISCHARGE INSTRUCTIONS   1) The drugs that you were given will stay in your system until tomorrow so for the next 24 hours you should not:  A) Drive an automobile B) Make any legal decisions C) Drink any alcoholic beverage   2) You may resume regular meals tomorrow.  Today it is better to start with liquids and gradually work up to solid foods.  You may eat anything you prefer, but it is better to start with liquids, then soup and crackers, and gradually work up to solid foods.   3) Please notify your doctor immediately if you have any unusual bleeding, trouble breathing, redness and pain at the surgery site, drainage, fever, or pain not relieved by medication.    4) Additional Instructions:        Please contact your physician with any problems or Same Day Surgery at [phone], Monday through Friday 6 am to 4 pm, or [place] [facility] number at [phone].

## 2018-02-15 NOTE — Anesthesia Post-op Follow-up Note (Signed)
Anesthesia QCDR form completed.        

## 2018-02-15 NOTE — Anesthesia Preprocedure Evaluation (Addendum)
Anesthesia Evaluation  Patient identified by MRN, date of birth, ID band Patient awake    Reviewed: Allergy & Precautions, H&P , NPO status , Patient's Chart, lab work & pertinent test results  Airway Mallampati: III  TM Distance: >3 FB Neck ROM: limited    Dental  (+) Chipped, Missing   Pulmonary neg pulmonary ROS, former smoker,    breath sounds clear to auscultation       Cardiovascular hypertension, +CHF  negative cardio ROS  + pacemaker   EF 40%, mild-mod MR, mod-severe TR, enlarged RA   Neuro/Psych PSYCHIATRIC DISORDERS Dementia CVA negative neurological ROS  negative psych ROS   GI/Hepatic negative GI ROS, Neg liver ROS, GERD  ,  Endo/Other  negative endocrine ROS  Renal/GU negative Renal ROS  negative genitourinary   Musculoskeletal   Abdominal   Peds  Hematology negative hematology ROS (+) anemia ,   Anesthesia Other Findings Past Medical History: No date: A-fib (HCC) No date: Anemia No date: GERD (gastroesophageal reflux disease) No date: History of kidney stones No date: HLD (hyperlipidemia) No date: HTN (hypertension) No date: Presence of permanent cardiac pacemaker No date: Prostate cancer ([place]) No date: Sick sinus syndrome (HCC) No date: Urinary incontinence  Past Surgical History: No date: APPENDECTOMY No date: EYE SURGERY     Comment:  bilateral cataract No date: INSERT / REPLACE / REMOVE PACEMAKER No date: prostatectomy     Reproductive/Obstetrics negative OB ROS                            Anesthesia Physical Anesthesia Plan  ASA: IV  Anesthesia Plan: General   Post-op Pain Management:    Induction:   PONV Risk Score and Plan: Propofol infusion and TIVA  Airway Management Planned: Natural Airway and Nasal Cannula  Additional Equipment:   Intra-op Plan:   Post-operative Plan:   Informed Consent: I have reviewed the patients History and  Physical, chart, labs and discussed the procedure including the risks, benefits and alternatives for the proposed anesthesia with the patient or authorized representative who has indicated his/her understanding and acceptance.   Dental Advisory Given  Plan Discussed with: Anesthesiologist, CRNA and Surgeon  Anesthesia Plan Comments:        Anesthesia Quick Evaluation

## 2018-02-15 NOTE — Interval H&P Note (Signed)
History and Physical Interval Note:  02/15/2018 11:55 AM  William Ryan  has presented today for surgery, with the diagnosis of SICK SINUS SYNDROME, AFIB,ERI  The various methods of treatment have been discussed with the patient and family. After consideration of risks, benefits and other options for treatment, the patient has consented to  Procedure(s): CHANGE OUT SINGLE CHAMBER PACEMAKER (Left) as a surgical intervention .  The patient's history has been reviewed, patient examined, no change in status, stable for surgery.  I have reviewed the patient's chart and labs.  Questions were answered to the patient's satisfaction.     William Ryan [employer]

## 2018-02-15 NOTE — Progress Notes (Signed)
Pt scratching right side of chest  Neck and left side of face     Dr fitzgerald in to see pt and ordered hydrocortisone cream

## 2018-02-16 ENCOUNTER — Encounter: Payer: Self-pay | Admitting: Cardiology

## 2018-02-16 NOTE — Anesthesia Postprocedure Evaluation (Signed)
Anesthesia Post Note  Patient: IHAN PAT  Procedure(s) Performed: CHANGE OUT SINGLE CHAMBER PACEMAKER (Left Chest)  Patient location during evaluation: PACU Anesthesia Type: General Level of consciousness: awake and alert Pain management: pain level controlled Vital Signs Assessment: post-procedure vital signs reviewed and stable Respiratory status: spontaneous breathing, nonlabored ventilation and respiratory function stable Cardiovascular status: blood pressure returned to baseline and stable Postop Assessment: no apparent nausea or vomiting Anesthetic complications: no     Last Vitals:  Vitals:   02/15/18 1355 02/15/18 1435  BP: (!) 181/91 (!) 178/94  Pulse: 70 71  Resp: 16 16  Temp: 36.5 C   SpO2: 98% 98%    Last Pain:  Vitals:   02/15/18 1435  TempSrc:   PainSc: 0-No pain                 Durenda Hurt

## 2018-07-12 ENCOUNTER — Emergency Department: Payer: Medicare Other

## 2018-07-12 ENCOUNTER — Other Ambulatory Visit: Payer: Self-pay

## 2018-07-12 ENCOUNTER — Emergency Department
Admission: EM | Admit: 2018-07-12 | Discharge: 2018-07-12 | Disposition: A | Discharge status: Home / Self Care | Payer: Medicare Other | Attending: Emergency Medicine | Admitting: Emergency Medicine

## 2018-07-12 DIAGNOSIS — F0390 Unspecified dementia without behavioral disturbance: Secondary | ICD-10-CM | POA: Insufficient documentation

## 2018-07-12 DIAGNOSIS — G20 Parkinson's disease: Secondary | ICD-10-CM | POA: Diagnosis not present

## 2018-07-12 DIAGNOSIS — Z7901 Long term (current) use of anticoagulants: Secondary | ICD-10-CM | POA: Insufficient documentation

## 2018-07-12 DIAGNOSIS — I10 Essential (primary) hypertension: Secondary | ICD-10-CM | POA: Insufficient documentation

## 2018-07-12 DIAGNOSIS — Z87891 Personal history of nicotine dependence: Secondary | ICD-10-CM | POA: Insufficient documentation

## 2018-07-12 DIAGNOSIS — W19XXXA Unspecified fall, initial encounter: Secondary | ICD-10-CM

## 2018-07-12 DIAGNOSIS — Z8546 Personal history of malignant neoplasm of prostate: Secondary | ICD-10-CM | POA: Insufficient documentation

## 2018-07-12 DIAGNOSIS — Z7982 Long term (current) use of aspirin: Secondary | ICD-10-CM | POA: Insufficient documentation

## 2018-07-12 DIAGNOSIS — Z79899 Other long term (current) drug therapy: Secondary | ICD-10-CM | POA: Diagnosis not present

## 2018-07-12 DIAGNOSIS — Z950 Presence of cardiac pacemaker: Secondary | ICD-10-CM | POA: Insufficient documentation

## 2018-07-12 DIAGNOSIS — M549 Dorsalgia, unspecified: Secondary | ICD-10-CM | POA: Diagnosis present

## 2018-07-12 DIAGNOSIS — R339 Retention of urine, unspecified: Secondary | ICD-10-CM | POA: Insufficient documentation

## 2018-07-12 DIAGNOSIS — W010XXA Fall on same level from slipping, tripping and stumbling without subsequent striking against object, initial encounter: Secondary | ICD-10-CM | POA: Insufficient documentation

## 2018-07-12 DIAGNOSIS — M545 Low back pain: Secondary | ICD-10-CM | POA: Insufficient documentation

## 2018-07-12 LAB — CBC
HEMATOCRIT: 34.1 % — AB (ref 39.0–52.0)
Hemoglobin: 10.8 g/dL — ABNORMAL LOW (ref 13.0–17.0)
MCH: 29.4 pg (ref 26.0–34.0)
MCHC: 31.7 g/dL (ref 30.0–36.0)
MCV: 92.9 fL (ref 80.0–100.0)
Platelets: 185 10*3/uL (ref 150–400)
RBC: 3.67 MIL/uL — ABNORMAL LOW (ref 4.22–5.81)
RDW: 15.2 % (ref 11.5–15.5)
WBC: 11.3 10*3/uL — AB (ref 4.0–10.5)
nRBC: 0 % (ref 0.0–0.2)

## 2018-07-12 LAB — COMPREHENSIVE METABOLIC PANEL
ALT: 13 U/L (ref 0–44)
ANION GAP: 8 (ref 5–15)
AST: 26 U/L (ref 15–41)
Albumin: 4.1 g/dL (ref 3.5–5.0)
Alkaline Phosphatase: 64 U/L (ref 38–126)
BILIRUBIN TOTAL: 1 mg/dL (ref 0.3–1.2)
BUN: 18 mg/dL (ref 8–23)
CO2: 24 mmol/L (ref 22–32)
Calcium: 8.8 mg/dL — ABNORMAL LOW (ref 8.9–10.3)
Chloride: 101 mmol/L (ref 98–111)
Creatinine, Ser: 1.19 mg/dL (ref 0.61–1.24)
GFR, EST NON AFRICAN AMERICAN: 52 mL/min — AB (ref >60)
Glucose, Bld: 164 mg/dL — ABNORMAL HIGH (ref 70–99)
POTASSIUM: 3.8 mmol/L (ref 3.5–5.1)
Sodium: 133 mmol/L — ABNORMAL LOW (ref 135–145)
TOTAL PROTEIN: 7.1 g/dL (ref 6.5–8.1)

## 2018-07-12 LAB — TROPONIN I

## 2018-07-12 LAB — CK: Total CK: 230 U/L (ref 49–397)

## 2018-07-12 MED ORDER — SODIUM CHLORIDE 0.9 % IV BOLUS
500.0000 mL | Freq: Once | INTRAVENOUS | Status: AC
  Administered 2018-07-12: 500 mL via INTRAVENOUS

## 2018-07-12 MED ORDER — ONDANSETRON HCL 4 MG/2ML IJ SOLN
4.0000 mg | Freq: Once | INTRAMUSCULAR | Status: AC
  Administered 2018-07-12: 4 mg via INTRAVENOUS
  Filled 2018-07-12: qty 2

## 2018-07-12 NOTE — ED Notes (Signed)
RN was notified about bladder scan and the need to measure his urine output.

## 2018-07-12 NOTE — ED Notes (Signed)
Post-void residua measured at 780ml. MD made aware.

## 2018-07-12 NOTE — ED Triage Notes (Signed)
Pt comes from [facility] home after falling last night. No one there knows how long he has been on the floor. Pt c/o penile pain with EMS but c/o back pain now. Pt reports he is only cold. Pt does not know what happened or how he got to the floor.

## 2018-07-12 NOTE — ED Provider Notes (Addendum)
[facility] Emergency Department Provider Note  Time seen: 8:45 AM  I have reviewed the triage vital signs and the nursing notes.   HISTORY  Chief Complaint Fall    HPI William Ryan is a 85 y.o. male with a past medical history of atrial fibrillation on Eliquis, gastric reflux, hypertension, hyperlipidemia, dementia, presents to the emergency department after a fall.  According to EMS report patient was found down at his nursing facility, patient does not recall the fall nor how long he was on the floor for.  Patient is on Eliquis for chronic A. fib per EMS report.  Here patient is awake and alert, oriented to person only which appears to be baseline.  Does not appear to be in any discomfort however when asked he states his back is hurting.   Past Medical History:  Diagnosis Date  . A-fib ([place])   . Anemia   . GERD (gastroesophageal reflux disease)   . History of kidney stones   . HLD (hyperlipidemia)   . HTN (hypertension)   . Presence of permanent cardiac pacemaker   . Prostate cancer ([place])   . Sick sinus syndrome ([place])   . Urinary incontinence     Patient Active Problem List   Diagnosis Date Noted  . Absence of bladder continence 04/28/2014  . Acute CVA (cerebrovascular accident) ([place]) 04/28/2014  . Essential hypertension 04/17/2014  . Parkinson's disease dementia ([place]) 04/17/2014  . Paroxysmal A-fib ([place]) 04/17/2014    Past Surgical History:  Procedure Laterality Date  . APPENDECTOMY    . EYE SURGERY     bilateral cataract  . INSERT / REPLACE / REMOVE PACEMAKER    . PACEMAKER INSERTION Left 02/15/2018   Procedure: CHANGE OUT SINGLE CHAMBER PACEMAKER;  Surgeon: Isaias Cowman, MD;  Location: [facility] ORS;  Service: Cardiovascular;  Laterality: Left;  . prostatectomy      Prior to Admission medications   Medication Sig Start Date End Date Taking? Authorizing Provider  acetaminophen (TYLENOL) 500 MG tablet Take 1,000 mg by mouth daily as  needed for moderate pain.    [provider]  apixaban (ELIQUIS) 2.5 MG TABS tablet Take 2.5 mg by mouth 2 (two) times daily.     [provider]  aspirin 81 MG tablet Take 81 mg by mouth 2 (two) times daily.     [provider]  cephALEXin (KEFLEX) 250 MG capsule Take 1 capsule (250 mg total) by mouth 4 (four) times daily. 02/15/18   Paraschos, Alexander, MD  oxybutynin (DITROPAN) 5 MG tablet Take 5 mg by mouth 2 (two) times daily.  01/18/18   [provider]  pantoprazole (PROTONIX) 40 MG tablet Take 40 mg by mouth daily.     [provider]  polyethylene glycol (MIRALAX / GLYCOLAX) packet Take 17 g by mouth daily as needed.    [provider]    No Known Allergies  History reviewed. No pertinent family history.  Social History Social History   Tobacco Use  . Smoking status: Former Research scientist (life sciences)  . Smokeless tobacco: Never Used  Substance Use Topics  . Alcohol use: Not Currently    Alcohol/week: 0.0 standard drinks    Frequency: Never  . Drug use: Never    Review of Systems Unable to complete an adequate/accurate review of systems secondary to baseline dementia  ____________________________________________   PHYSICAL EXAM:  VITAL SIGNS: ED Triage Vitals  Enc Vitals Group     BP 07/12/18 0835 (!) 180/94  Pulse Rate 07/12/18 0835 76     Resp 07/12/18 0837 20     Temp 07/12/18 0837 98.1 F (36.7 C)     Temp Source 07/12/18 0837 Oral     SpO2 07/12/18 0835 97 %     Weight 07/12/18 0834 209 lb 7 oz (95 kg)     Height 07/12/18 0834 5\' 9"  (1.753 m)     Head Circumference --      Peak Flow --      Pain Score --      Pain Loc --      Pain Edu? --      Excl. in [place]? --    Constitutional: Alert, oriented to person only.  No distress, calm and cooperative lying in bed. Eyes: Normal exam ENT   Head: Normocephalic and atraumatic.   Mouth/Throat: Mucous membranes are moist. Cardiovascular: Normal rate, regular rhythm.   Respiratory: Normal respiratory effort without tachypnea nor retractions. Breath sounds are clear  Gastrointestinal: Soft and nontender. No distention. Musculoskeletal: Nontender with normal range of motion in all extremities. No lower extremity tenderness or edema.  Patient does have mild tenderness in the T and L-spine no focal area of tenderness identified.  No ecchymosis. Neurologic:  Normal speech and language. No gross focal neurologic deficits  Skin:  Skin is warm, dry and intact.  Psychiatric: Mood and affect are normal.  ____________________________________________    EKG  EKG viewed and interpreted by myself shows ventricular paced rhythm at 70 bpm with a widened QRS, nonspecific ST changes  ____________________________________________    RADIOLOGY  X-rays and CTs are negative for acute abnormality.  ____________________________________________   INITIAL IMPRESSION / ASSESSMENT AND PLAN / ED COURSE  Pertinent labs & imaging results that were available during my care of the patient were reviewed by me and considered in my medical decision making (see chart for details).  Patient presents to the emergency department after an unwitnessed fall at his nursing facility.  Patient has a baseline history of dementia, unable to contribute significantly to his history.  We will obtain CT imaging of the head and neck.  Patient does have mild T and L-spine tenderness to palpation we will obtain thoracic and lumbar as well as pelvis x-rays as a precaution.  Overall the patient appears very well, no distress, lying comfortably in bed speaking calmly.  Patient vomited while getting up for x-rays.  We will check labs, treat with Zofran and IV fluids while awaiting results.  Patient's lab work has resulted largely within normal limits.  CT scans and x-rays are negative for acute abnormality.  Currently the patient appears well we will discharge back to his nursing facility.  Patient  states he needed to urinate, was unable to urinate in the urinal.  Nurse checked a bladder scan that showed 1500 cc of urine.  The nurse then got the patient to the toilet and he was able to urinate approximately 7 to 800 cc of urine.  Denies any abdominal pain.  Still believe the patient is safe for discharge home.  This will be conveyed to nursing home staff so they can continue to monitor the situation.  ____________________________________________   FINAL CLINICAL IMPRESSION(S) / ED DIAGNOSES  Cheral Marker, MD 07/12/18 1030    Harvest Dark, MD 07/12/18 1105

## 2018-07-12 NOTE — ED Notes (Signed)
Xray notified this nurse that pt was vomiting. Pt will be taken to CT then come back to [place]. MD notified at this time.

## 2018-07-12 NOTE — ED Notes (Signed)
Pt voided 416ml again before discharge.

## 2018-07-12 NOTE — ED Notes (Signed)
Pt was unable to void and bladder scan showed 153ml. Pt able to void 689ml while standing with urinal. MD notified at this time.

## 2018-07-12 NOTE — ED Notes (Signed)
Patient transported to X-ray 

## 2018-08-19 ENCOUNTER — Other Ambulatory Visit: Payer: Self-pay

## 2018-08-19 ENCOUNTER — Emergency Department: Payer: Medicare Other

## 2018-08-19 ENCOUNTER — Inpatient Hospital Stay
Admission: EM | Admit: 2018-08-19 | Discharge: 2018-08-25 | DRG: 084 | Disposition: A | Discharge status: Hospice | Payer: Medicare Other | Source: Skilled Nursing Facility | Attending: Internal Medicine | Admitting: Internal Medicine

## 2018-08-19 DIAGNOSIS — Z8546 Personal history of malignant neoplasm of prostate: Secondary | ICD-10-CM

## 2018-08-19 DIAGNOSIS — K219 Gastro-esophageal reflux disease without esophagitis: Secondary | ICD-10-CM | POA: Diagnosis present

## 2018-08-19 DIAGNOSIS — Z7901 Long term (current) use of anticoagulants: Secondary | ICD-10-CM | POA: Diagnosis not present

## 2018-08-19 DIAGNOSIS — Z87891 Personal history of nicotine dependence: Secondary | ICD-10-CM | POA: Diagnosis not present

## 2018-08-19 DIAGNOSIS — F0390 Unspecified dementia without behavioral disturbance: Secondary | ICD-10-CM | POA: Diagnosis present

## 2018-08-19 DIAGNOSIS — Z79899 Other long term (current) drug therapy: Secondary | ICD-10-CM

## 2018-08-19 DIAGNOSIS — W19XXXA Unspecified fall, initial encounter: Secondary | ICD-10-CM

## 2018-08-19 DIAGNOSIS — Z9181 History of falling: Secondary | ICD-10-CM | POA: Diagnosis not present

## 2018-08-19 DIAGNOSIS — Z7982 Long term (current) use of aspirin: Secondary | ICD-10-CM | POA: Diagnosis not present

## 2018-08-19 DIAGNOSIS — Z515 Encounter for palliative care: Secondary | ICD-10-CM | POA: Diagnosis present

## 2018-08-19 DIAGNOSIS — Z950 Presence of cardiac pacemaker: Secondary | ICD-10-CM | POA: Diagnosis not present

## 2018-08-19 DIAGNOSIS — I10 Essential (primary) hypertension: Secondary | ICD-10-CM | POA: Diagnosis present

## 2018-08-19 DIAGNOSIS — Z7189 Other specified counseling: Secondary | ICD-10-CM | POA: Diagnosis not present

## 2018-08-19 DIAGNOSIS — Z66 Do not resuscitate: Secondary | ICD-10-CM | POA: Diagnosis present

## 2018-08-19 DIAGNOSIS — W1830XA Fall on same level, unspecified, initial encounter: Secondary | ICD-10-CM | POA: Diagnosis present

## 2018-08-19 DIAGNOSIS — S065X9A Traumatic subdural hemorrhage with loss of consciousness of unspecified duration, initial encounter: Principal | ICD-10-CM | POA: Diagnosis present

## 2018-08-19 DIAGNOSIS — S065XAA Traumatic subdural hemorrhage with loss of consciousness status unknown, initial encounter: Secondary | ICD-10-CM

## 2018-08-19 DIAGNOSIS — Y92099 Unspecified place in other non-institutional residence as the place of occurrence of the external cause: Secondary | ICD-10-CM

## 2018-08-19 DIAGNOSIS — R296 Repeated falls: Secondary | ICD-10-CM | POA: Diagnosis present

## 2018-08-19 DIAGNOSIS — R4182 Altered mental status, unspecified: Secondary | ICD-10-CM | POA: Diagnosis present

## 2018-08-19 DIAGNOSIS — I480 Paroxysmal atrial fibrillation: Secondary | ICD-10-CM | POA: Diagnosis present

## 2018-08-19 DIAGNOSIS — E785 Hyperlipidemia, unspecified: Secondary | ICD-10-CM | POA: Diagnosis present

## 2018-08-19 DIAGNOSIS — R339 Retention of urine, unspecified: Secondary | ICD-10-CM | POA: Diagnosis present

## 2018-08-19 LAB — URINALYSIS, COMPLETE (UACMP) WITH MICROSCOPIC
Bacteria, UA: NONE SEEN
Bilirubin Urine: NEGATIVE
Glucose, UA: NEGATIVE mg/dL
HGB URINE DIPSTICK: NEGATIVE
Ketones, ur: NEGATIVE mg/dL
Leukocytes,Ua: NEGATIVE
Nitrite: NEGATIVE
Protein, ur: NEGATIVE mg/dL
Specific Gravity, Urine: 1.01 (ref 1.005–1.030)
WBC, UA: NONE SEEN WBC/hpf (ref 0–5)
pH: 6 (ref 5.0–8.0)

## 2018-08-19 LAB — CBC WITH DIFFERENTIAL/PLATELET
Abs Immature Granulocytes: 0.04 10*3/uL (ref 0.00–0.07)
BASOS PCT: 0 %
Basophils Absolute: 0 10*3/uL (ref 0.0–0.1)
EOS ABS: 0.2 10*3/uL (ref 0.0–0.5)
Eosinophils Relative: 2 %
HCT: 32.4 % — ABNORMAL LOW (ref 39.0–52.0)
Hemoglobin: 10.2 g/dL — ABNORMAL LOW (ref 13.0–17.0)
Immature Granulocytes: 0 %
Lymphocytes Relative: 40 %
Lymphs Abs: 3.7 10*3/uL (ref 0.7–4.0)
MCH: 30 pg (ref 26.0–34.0)
MCHC: 31.5 g/dL (ref 30.0–36.0)
MCV: 95.3 fL (ref 80.0–100.0)
Monocytes Absolute: 0.8 10*3/uL (ref 0.1–1.0)
Monocytes Relative: 9 %
Neutro Abs: 4.4 10*3/uL (ref 1.7–7.7)
Neutrophils Relative %: 49 %
Platelets: 179 10*3/uL (ref 150–400)
RBC: 3.4 MIL/uL — ABNORMAL LOW (ref 4.22–5.81)
RDW: 15.3 % (ref 11.5–15.5)
WBC: 9.1 10*3/uL (ref 4.0–10.5)
nRBC: 0 % (ref 0.0–0.2)

## 2018-08-19 LAB — COMPREHENSIVE METABOLIC PANEL
ALT: 12 U/L (ref 0–44)
AST: 27 U/L (ref 15–41)
Albumin: 3.8 g/dL (ref 3.5–5.0)
Alkaline Phosphatase: 67 U/L (ref 38–126)
Anion gap: 7 (ref 5–15)
BUN: 21 mg/dL (ref 8–23)
CHLORIDE: 103 mmol/L (ref 98–111)
CO2: 25 mmol/L (ref 22–32)
Calcium: 8.8 mg/dL — ABNORMAL LOW (ref 8.9–10.3)
Creatinine, Ser: 1.24 mg/dL (ref 0.61–1.24)
GFR calc Af Amer: 57 mL/min — ABNORMAL LOW (ref >60)
GFR calc non Af Amer: 49 mL/min — ABNORMAL LOW (ref >60)
Glucose, Bld: 119 mg/dL — ABNORMAL HIGH (ref 70–99)
Potassium: 3.9 mmol/L (ref 3.5–5.1)
Sodium: 135 mmol/L (ref 135–145)
Total Bilirubin: 0.7 mg/dL (ref 0.3–1.2)
Total Protein: 6.6 g/dL (ref 6.5–8.1)

## 2018-08-19 LAB — MRSA PCR SCREENING: MRSA by PCR: NEGATIVE

## 2018-08-19 LAB — PROTIME-INR
INR: 1.4 — ABNORMAL HIGH (ref 0.8–1.2)
Prothrombin Time: 16.9 seconds — ABNORMAL HIGH (ref 11.4–15.2)

## 2018-08-19 LAB — TROPONIN I: Troponin I: <0.03 ng/mL (ref <0.03)

## 2018-08-19 MED ORDER — POLYETHYLENE GLYCOL 3350 17 G PO PACK: 17.0000 g | PACK | Freq: Every day | ORAL | Status: DC | PRN

## 2018-08-19 MED ORDER — TRAMADOL HCL 50 MG PO TABS
50.0000 mg | ORAL_TABLET | Freq: Four times a day (QID) | ORAL | Status: DC | PRN
  Administered 2018-08-19 – 2018-08-22 (×3): 50 mg via ORAL
  Filled 2018-08-19 (×3): qty 1

## 2018-08-19 MED ORDER — SODIUM CHLORIDE 0.9 % IV SOLN
INTRAVENOUS | Status: DC
  Administered 2018-08-19 – 2018-08-23 (×6): via INTRAVENOUS

## 2018-08-19 MED ORDER — ACETAMINOPHEN 325 MG PO TABS
1000.0000 mg | ORAL_TABLET | Freq: Three times a day (TID) | ORAL | Status: DC | PRN
  Administered 2018-08-22: 18:00:00 975 mg via ORAL
  Filled 2018-08-19: qty 3

## 2018-08-19 MED ORDER — PANTOPRAZOLE SODIUM 40 MG PO TBEC
40.0000 mg | DELAYED_RELEASE_TABLET | Freq: Every day | ORAL | Status: DC
  Administered 2018-08-19 – 2018-08-25 (×7): 40 mg via ORAL
  Filled 2018-08-19 (×7): qty 1

## 2018-08-19 MED ORDER — ONDANSETRON HCL 4 MG PO TABS: 4.0000 mg | ORAL_TABLET | Freq: Four times a day (QID) | ORAL | Status: DC | PRN

## 2018-08-19 MED ORDER — ONDANSETRON HCL 4 MG/2ML IJ SOLN: 4.0000 mg | Freq: Four times a day (QID) | INTRAMUSCULAR | Status: DC | PRN

## 2018-08-19 MED ORDER — SODIUM CHLORIDE 0.9 % IV SOLN: 250.0000 mL | INTRAVENOUS | Status: DC | PRN

## 2018-08-19 MED ORDER — SODIUM CHLORIDE 0.9% FLUSH: 3.0000 mL | INTRAVENOUS | Status: DC | PRN

## 2018-08-19 MED ORDER — OXYBUTYNIN CHLORIDE 5 MG PO TABS
5.0000 mg | ORAL_TABLET | Freq: Two times a day (BID) | ORAL | Status: DC
  Filled 2018-08-19: qty 1

## 2018-08-19 MED ORDER — HYDRALAZINE HCL 20 MG/ML IJ SOLN
10.0000 mg | Freq: Four times a day (QID) | INTRAMUSCULAR | Status: DC | PRN
  Administered 2018-08-21: 10 mg via INTRAVENOUS
  Filled 2018-08-19: qty 1

## 2018-08-19 MED ORDER — SODIUM CHLORIDE 0.9% FLUSH
3.0000 mL | Freq: Two times a day (BID) | INTRAVENOUS | Status: DC
  Administered 2018-08-20 – 2018-08-24 (×5): 3 mL via INTRAVENOUS

## 2018-08-19 NOTE — ED Notes (Signed)
Pt returned from CT °

## 2018-08-19 NOTE — H&P (Signed)
[facility] NAME: William Ryan    MR#:  [id number]  DATE OF BIRTH:  Apr 03, 1922  DATE OF ADMISSION:  08/19/2018  PRIMARY CARE PHYSICIAN: Idelle Crouch, MD   REQUESTING/REFERRING PHYSICIAN: Schuyler Amor, MD  CHIEF COMPLAINT:   Chief Complaint  Patient presents with  . Fall    HISTORY OF PRESENT ILLNESS: William Ryan  is a 85 y.o. male with a known history of atrial fibrillation, anemia, GERD, hyperlipidemia, hypertension, history of pacemaker, prostate cancer, sick sinus syndrome who currently resides in [place].  Patient has a history of recurrent falls.  He fell yesterday this morning he was confused he was brought to the emergency room CT scan shows subdural hematoma.  ER physician has discussed with patient's son and healthcare power of attorney regarding current presentation.  They do not want him to get any neurosurgical intervention.  The ED physician spoke to the on-call neurologist who recommends monitoring patient in the hospital.  Repeating a CT scan.  Patient himself is confused and not able to contribute much to his history.  PAST MEDICAL HISTORY:   Past Medical History:  Diagnosis Date  . A-fib ([place])   . Anemia   . GERD (gastroesophageal reflux disease)   . History of kidney stones   . HLD (hyperlipidemia)   . HTN (hypertension)   . Presence of permanent cardiac pacemaker   . Prostate cancer ([place])   . Sick sinus syndrome ([place])   . Urinary incontinence     PAST SURGICAL HISTORY:  Past Surgical History:  Procedure Laterality Date  . APPENDECTOMY    . EYE SURGERY     bilateral cataract  . INSERT / REPLACE / REMOVE PACEMAKER    . PACEMAKER INSERTION Left 02/15/2018   Procedure: CHANGE OUT SINGLE CHAMBER PACEMAKER;  Surgeon: Isaias Cowman, MD;  Location: [facility] ORS;  Service: Cardiovascular;  Laterality: Left;  . prostatectomy      SOCIAL HISTORY:  Social History   Tobacco Use  . Smoking  status: Former Research scientist (life sciences)  . Smokeless tobacco: Never Used  Substance Use Topics  . Alcohol use: Not Currently    Alcohol/week: 0.0 standard drinks    Frequency: Never    FAMILY HISTORY: History reviewed. No pertinent family history.  DRUG ALLERGIES: No Known Allergies  REVIEW OF SYSTEMS:   CONSTITUTIONAL: Unable to provide MEDICATIONS AT HOME:  Prior to Admission medications   Medication Sig Start Date End Date Taking? Authorizing Provider  acetaminophen (TYLENOL) 650 MG CR tablet Take 1,300 mg by mouth every 8 (eight) hours as needed for pain.   Yes [provider]  apixaban (ELIQUIS) 2.5 MG TABS tablet Take 2.5 mg by mouth 2 (two) times daily.    Yes [provider]  aspirin 81 MG tablet Take 81 mg by mouth 2 (two) times daily.    Yes [provider]  oxybutynin (DITROPAN) 5 MG tablet Take 5 mg by mouth 2 (two) times daily.  01/18/18  Yes [provider]  pantoprazole (PROTONIX) 40 MG tablet Take 40 mg by mouth daily.    Yes [provider]  polyethylene glycol (MIRALAX / GLYCOLAX) packet Take 17 g by mouth daily as needed.   Yes [provider]      PHYSICAL EXAMINATION:   VITAL SIGNS: Blood pressure (!) 132/111, pulse 71, temperature (!) 97.4 F (36.3 C), temperature source Oral, resp. rate 18, height 5\' 11"  (1.803 m), weight 95.7 kg, SpO2  96 %.  GENERAL:  85 y.o.-year-old patient lying in the bed with no acute distress.  EYES: Pupils equal, round, reactive to light and accommodation. No scleral icterus. Extraocular muscles intact.  HEENT: Head atraumatic, normocephalic. Oropharynx and nasopharynx clear.  NECK:  Supple, no jugular venous distention. No thyroid enlargement, no tenderness.  LUNGS: Normal breath sounds bilaterally, no wheezing, rales,rhonchi or crepitation. No use of accessory muscles of respiration.  CARDIOVASCULAR: S1, S2 normal. No murmurs, rubs, or gallops.  ABDOMEN: Soft, nontender, nondistended. Bowel  sounds present. No organomegaly or mass.  EXTREMITIES: No pedal edema, cyanosis, or clubbing.  NEUROLOGIC: Moving all extremities spontaneously  pSYCHIATRIC: The patient is awake but not oriented SKIN: No obvious rash, lesion, or ulcer.   LABORATORY PANEL:   CBC Recent Labs  Lab 08/19/18 1324  WBC 9.1  HGB 10.2*  HCT 32.4*  PLT 179  MCV 95.3  MCH 30.0  MCHC 31.5  RDW 15.3  LYMPHSABS 3.7  MONOABS 0.8  EOSABS 0.2  BASOSABS 0.0   ------------------------------------------------------------------------------------------------------------------  Chemistries  Recent Labs  Lab 08/19/18 1324  NA 135  K 3.9  CL 103  CO2 25  GLUCOSE 119*  BUN 21  CREATININE 1.24  CALCIUM 8.8*  AST 27  ALT 12  ALKPHOS 67  BILITOT 0.7   ------------------------------------------------------------------------------------------------------------------ estimated creatinine clearance is 43 mL/min (by C-G formula based on SCr of 1.24 mg/dL). ------------------------------------------------------------------------------------------------------------------ No results for input(s): TSH, T4TOTAL, T3FREE, THYROIDAB in the last 72 hours.  Invalid input(s): FREET3   Coagulation profile Recent Labs  Lab 08/19/18 1324  INR 1.4*   ------------------------------------------------------------------------------------------------------------------- No results for input(s): DDIMER in the last 72 hours. -------------------------------------------------------------------------------------------------------------------  Cardiac Enzymes Recent Labs  Lab 08/19/18 1324  TROPONINI <0.03   ------------------------------------------------------------------------------------------------------------------ Invalid input(s): POCBNP  ---------------------------------------------------------------------------------------------------------------  Urinalysis    Component Value Date/Time   COLORURINE Yellow  04/09/2014 1730   APPEARANCEUR Clear 04/09/2014 1730   LABSPEC 1.014 04/09/2014 1730   PHURINE 6.0 04/09/2014 1730   GLUCOSEU Negative 04/09/2014 1730   HGBUR Negative 04/09/2014 1730   BILIRUBINUR Negative 04/09/2014 1730   KETONESUR Negative 04/09/2014 1730   PROTEINUR Negative 04/09/2014 1730   NITRITE Negative 04/09/2014 1730   LEUKOCYTESUR Negative 04/09/2014 1730     RADIOLOGY: Ct Head Wo Contrast  Result Date: 08/19/2018 CLINICAL DATA:  Fall.  Right orbital contusion EXAM: CT HEAD WITHOUT CONTRAST CT MAXILLOFACIAL WITHOUT CONTRAST CT CERVICAL SPINE WITHOUT CONTRAST TECHNIQUE: Multidetector CT imaging of the head, cervical spine, and maxillofacial structures were performed using the standard protocol without intravenous contrast. Multiplanar CT image reconstructions of the cervical spine and maxillofacial structures were also generated. COMPARISON:  CT head 07/12/2018 FINDINGS: CT HEAD FINDINGS Brain: Large mixed density subdural hemorrhage over the left frontal convexity measuring approximately 24 mm in thickness. Mixture of intermediate and high density blood products with mass-effect and mild midline shift. 6 mm midline shift to the right. Small high-density subdural hematoma left frontal lobe above the orbit and extending anterior to the frontal lobe. Advanced atrophy. Ventricular enlargement consistent with atrophy. Chronic microvascular ischemic change in the white matter. No acute ischemic infarct Vascular: Negative for hyperdense vessel Skull: Negative for skull fracture Other: None CT MAXILLOFACIAL FINDINGS Osseous: Negative for facial fracture Orbits: Bilateral cataract surgery.  Negative for orbital mass. Sinuses: Clear Soft tissues: Soft tissue swelling around the right eye compatible with contusion. CT CERVICAL SPINE FINDINGS Alignment: Mild anterolisthesis C2-3 and C3-4. Prominent cervical kyphosis at C4-5. Mild-to-moderate levoscoliosis. Skull base and vertebrae: Negative for  cervical spine fracture. Sclerotic lesion T2 vertebral body on the left is stable from prior studies and benign-appearing Soft tissues and spinal canal: Negative for mass or hematoma Disc levels: Multilevel disc degeneration and spurring throughout the cervical spine. Mild facet degeneration. Upper chest: Negative Other: None IMPRESSION: 1. 24 mm mixed density subdural hematoma left convexity with mass-effect and midline shift measuring 6 mm. Additional high-density small subdural hematoma left frontal lobe. Findings compatible with subacute and acute bleeding. 2. Atrophy and chronic microvascular ischemic changes throughout the white matter 3. Extensive cervical spondylosis. Negative for fracture cervical spine. Electronically Signed   By: Franchot Gallo M.D.   On: 08/19/2018 14:17   Ct Cervical Spine Wo Contrast  Result Date: 08/19/2018 CLINICAL DATA:  Fall.  Right orbital contusion EXAM: CT HEAD WITHOUT CONTRAST CT MAXILLOFACIAL WITHOUT CONTRAST CT CERVICAL SPINE WITHOUT CONTRAST TECHNIQUE: Multidetector CT imaging of the head, cervical spine, and maxillofacial structures were performed using the standard protocol without intravenous contrast. Multiplanar CT image reconstructions of the cervical spine and maxillofacial structures were also generated. COMPARISON:  CT head 07/12/2018 FINDINGS: CT HEAD FINDINGS Brain: Large mixed density subdural hemorrhage over the left frontal convexity measuring approximately 24 mm in thickness. Mixture of intermediate and high density blood products with mass-effect and mild midline shift. 6 mm midline shift to the right. Small high-density subdural hematoma left frontal lobe above the orbit and extending anterior to the frontal lobe. Advanced atrophy. Ventricular enlargement consistent with atrophy. Chronic microvascular ischemic change in the white matter. No acute ischemic infarct Vascular: Negative for hyperdense vessel Skull: Negative for skull fracture Other: None CT  MAXILLOFACIAL FINDINGS Osseous: Negative for facial fracture Orbits: Bilateral cataract surgery.  Negative for orbital mass. Sinuses: Clear Soft tissues: Soft tissue swelling around the right eye compatible with contusion. CT CERVICAL SPINE FINDINGS Alignment: Mild anterolisthesis C2-3 and C3-4. Prominent cervical kyphosis at C4-5. Mild-to-moderate levoscoliosis. Skull base and vertebrae: Negative for cervical spine fracture. Sclerotic lesion T2 vertebral body on the left is stable from prior studies and benign-appearing Soft tissues and spinal canal: Negative for mass or hematoma Disc levels: Multilevel disc degeneration and spurring throughout the cervical spine. Mild facet degeneration. Upper chest: Negative Other: None IMPRESSION: 1. 24 mm mixed density subdural hematoma left convexity with mass-effect and midline shift measuring 6 mm. Additional high-density small subdural hematoma left frontal lobe. Findings compatible with subacute and acute bleeding. 2. Atrophy and chronic microvascular ischemic changes throughout the white matter 3. Extensive cervical spondylosis. Negative for fracture cervical spine. Electronically Signed   By: Franchot Gallo M.D.   On: 08/19/2018 14:17   Dg Chest Port 1 View  Result Date: 08/19/2018 CLINICAL DATA:  Status post fall today. EXAM: PORTABLE CHEST 1 VIEW COMPARISON:  PA and lateral chest 02/14/2018. FINDINGS: Pacing device is in place. Mild subsegmental atelectasis in the lung bases is seen. Lungs otherwise clear. Heart size is enlarged. Aortic atherosclerosis noted. No acute or focal bony abnormality. IMPRESSION: No acute disease. Cardiomegaly. Atherosclerosis. Electronically Signed   By: Inge Rise M.D.   On: 08/19/2018 13:45   Ct Maxillofacial Wo Contrast  Result Date: 08/19/2018 CLINICAL DATA:  Fall.  Right orbital contusion EXAM: CT HEAD WITHOUT CONTRAST CT MAXILLOFACIAL WITHOUT CONTRAST CT CERVICAL SPINE WITHOUT CONTRAST TECHNIQUE: Multidetector CT imaging  of the head, cervical spine, and maxillofacial structures were performed using the standard protocol without intravenous contrast. Multiplanar CT image reconstructions of the cervical spine and maxillofacial structures were also generated. COMPARISON:  CT head 07/12/2018  FINDINGS: CT HEAD FINDINGS Brain: Large mixed density subdural hemorrhage over the left frontal convexity measuring approximately 24 mm in thickness. Mixture of intermediate and high density blood products with mass-effect and mild midline shift. 6 mm midline shift to the right. Small high-density subdural hematoma left frontal lobe above the orbit and extending anterior to the frontal lobe. Advanced atrophy. Ventricular enlargement consistent with atrophy. Chronic microvascular ischemic change in the white matter. No acute ischemic infarct Vascular: Negative for hyperdense vessel Skull: Negative for skull fracture Other: None CT MAXILLOFACIAL FINDINGS Osseous: Negative for facial fracture Orbits: Bilateral cataract surgery.  Negative for orbital mass. Sinuses: Clear Soft tissues: Soft tissue swelling around the right eye compatible with contusion. CT CERVICAL SPINE FINDINGS Alignment: Mild anterolisthesis C2-3 and C3-4. Prominent cervical kyphosis at C4-5. Mild-to-moderate levoscoliosis. Skull base and vertebrae: Negative for cervical spine fracture. Sclerotic lesion T2 vertebral body on the left is stable from prior studies and benign-appearing Soft tissues and spinal canal: Negative for mass or hematoma Disc levels: Multilevel disc degeneration and spurring throughout the cervical spine. Mild facet degeneration. Upper chest: Negative Other: None IMPRESSION: 1. 24 mm mixed density subdural hematoma left convexity with mass-effect and midline shift measuring 6 mm. Additional high-density small subdural hematoma left frontal lobe. Findings compatible with subacute and acute bleeding. 2. Atrophy and chronic microvascular ischemic changes throughout  the white matter 3. Extensive cervical spondylosis. Negative for fracture cervical spine. Electronically Signed   By: Franchot Gallo M.D.   On: 08/19/2018 14:17    EKG: Orders placed or performed during the hospital encounter of 08/19/18  . ED EKG  . ED EKG    IMPRESSION AND PLAN: Patient is 85 year old presenting after a fall  1.  Subdural hematoma supportive care Repeat CT of the head in the morning, if worsening on the CT scan family will need to be reconsulted they do not want any aggressive treatment, at that point would recommend comfort care  2.  Accelerated hypertension we will do hydralazine as needed   3.  History atrial fibrillation discontinue Eliquis would not use this medication in this patient with recurrent falls on discharge  4.  GERD continue Protonix  5.  Miscellaneous SCDs for DVT prophylaxis      All the records are reviewed and case discussed with ED provider. Management plans discussed with the patient, family and they are in agreement.  CODE STATUS: DNR Code Status History    Date Active Date Inactive Code Status Order ID Comments User Context   02/15/2018 1409 02/15/2018 1808 Full Code [id number]  Isaias Cowman, MD Inpatient       TOTAL TIME TAKING CARE OF THIS PATIENT: 55 minutes.    Dustin Flock M.D on 08/19/2018 at 3:32 PM  Between 7am to 6pm - Pager - [phone]  After 6pm go to [email or web address] - password [email or web address] [facility]  [phone]  CC: Primary care physician; Idelle Crouch, MD

## 2018-08-19 NOTE — ED Notes (Signed)
X-ray at bedside

## 2018-08-19 NOTE — ED Notes (Signed)
This Rn spoke to [place] at [facility]. She stated "I can only tell you what the report says" she stated the report said the pt fell about 6pm. States it was unwitnessed, staff came into room and found pt lying on R side. She stated she couldn't give out any other information about pt fall. Unknown downtime. Informed EDP.

## 2018-08-19 NOTE — ED Provider Notes (Addendum)
[facility] Emergency Department Provider Note  ____________________________________________   I have reviewed the triage vital signs and the nursing notes. Where available I have reviewed prior notes and, if possible and indicated, outside hospital notes.    HISTORY  Chief Complaint Fall    HPI William Ryan is a 85 y.o. male  Who is on Eliquis, paroxysmal atrial fibrillation, history of falls at the nursing home, presents today after a fall.  Fell yesterday apparently at 8 PM.  Nothing else is known about the fall.  He states he just fell.  He is also states he is not in clear recollection of it.  Patient states that he is doing "okay".  We did call the nursing home no further history was available from them or from the patient.  Level 5 chart caveat; no further history available due to patient status.  I also talked to Kathlee Nations, power of attorney, and Travares Nelles his son and they stressed that the patient is DNR/DNI, she is his POA, they do not want surgery they would not want him intubated, they want to make sure that he is comfortable.    Past Medical History:  Diagnosis Date  . A-fib ([place])   . Anemia   . GERD (gastroesophageal reflux disease)   . History of kidney stones   . HLD (hyperlipidemia)   . HTN (hypertension)   . Presence of permanent cardiac pacemaker   . Prostate cancer ([place])   . Sick sinus syndrome ([place])   . Urinary incontinence     Patient Active Problem List   Diagnosis Date Noted  . Absence of bladder continence 04/28/2014  . Acute CVA (cerebrovascular accident) ([place]) 04/28/2014  . Essential hypertension 04/17/2014  . Parkinson's disease dementia ([place]) 04/17/2014  . Paroxysmal A-fib ([place]) 04/17/2014    Past Surgical History:  Procedure Laterality Date  . APPENDECTOMY    . EYE SURGERY     bilateral cataract  . INSERT / REPLACE / REMOVE PACEMAKER    . PACEMAKER INSERTION Left 02/15/2018   Procedure: CHANGE OUT SINGLE  CHAMBER PACEMAKER;  Surgeon: Isaias Cowman, MD;  Location: [facility] ORS;  Service: Cardiovascular;  Laterality: Left;  . prostatectomy      Prior to Admission medications   Medication Sig Start Date End Date Taking? Authorizing Provider  acetaminophen (TYLENOL) 650 MG CR tablet Take 1,300 mg by mouth every 8 (eight) hours as needed for pain.    [provider]  apixaban (ELIQUIS) 2.5 MG TABS tablet Take 2.5 mg by mouth 2 (two) times daily.     [provider]  aspirin 81 MG tablet Take 81 mg by mouth 2 (two) times daily.     [provider]  cephALEXin (KEFLEX) 250 MG capsule Take 1 capsule (250 mg total) by mouth 4 (four) times daily. Patient not taking: Reported on 07/12/2018 02/15/18   Isaias Cowman, MD  oxybutynin (DITROPAN) 5 MG tablet Take 5 mg by mouth 2 (two) times daily.  01/18/18   [provider]  pantoprazole (PROTONIX) 40 MG tablet Take 40 mg by mouth daily.     [provider]  polyethylene glycol (MIRALAX / GLYCOLAX) packet Take 17 g by mouth daily as needed.    [provider]    Allergies Patient has no known allergies.  History reviewed. No pertinent family history.  Social History Social History   Tobacco Use  . Smoking status: Former Research scientist (life sciences)  . Smokeless tobacco: Never Used  Substance  Use Topics  . Alcohol use: Not Currently    Alcohol/week: 0.0 standard drinks    Frequency: Never  . Drug use: Never    Review of Systems Constitutional: No fever/chills Eyes: No visual changes. ENT: No sore throat. No stiff neck no neck pain Cardiovascular: Denies chest pain. Respiratory: Denies shortness of breath. Gastrointestinal:   no vomiting.  No diarrhea.  No constipation. Genitourinary: Negative for dysuria. Musculoskeletal: Negative lower extremity swelling Skin: Negative for rash. Neurological: Negative for severe headaches, focal weakness or  numbness.   ____________________________________________   PHYSICAL EXAM:  VITAL SIGNS: ED Triage Vitals  Enc Vitals Group     BP 08/19/18 1326 122/62     Pulse Rate 08/19/18 1326 71     Resp 08/19/18 1326 20     Temp 08/19/18 1327 (!) 97.4 F (36.3 C)     Temp Source 08/19/18 1326 Oral     SpO2 08/19/18 1326 96 %     Weight 08/19/18 1327 211 lb (95.7 kg)     Height 08/19/18 1327 5\' 11"  (1.803 m)     Head Circumference --      Peak Flow --      Pain Score 08/19/18 1326 0     Pain Loc --      Pain Edu? --      Excl. in [place]? --     Constitutional: Alert and oriented name and place, unsure of the exact date.. Well appearing and in no acute distress. Eyes: Conjunctivae are normal Head: Large black eye on the right, no ocular involvement HEENT: No congestion/rhinnorhea. Mucous membranes are moist.  Oropharynx non-erythematous Neck:   Nontender with no meningismus, no masses, no stridor Cardiovascular: Normal rate, regular rhythm. Grossly normal heart sounds.  Good peripheral circulation. Respiratory: Normal respiratory effort.  No retractions. Lungs CTAB. Abdominal: Soft and nontender. No distention. No guarding no rebound Back:  There is no focal tenderness or step off.  there is no midline tenderness there are no lesions noted. there is no CVA tenderness Musculoskeletal: No lower extremity tenderness, no upper extremity tenderness. No joint effusions, no DVT signs strong distal pulses no edema Neurologic:  Normal speech and language. No gross focal neurologic deficits are appreciated.  Skin:  Skin is warm, dry and intact. No rash noted. Psychiatric: Mood and affect are normal. Speech and behavior are normal.  ____________________________________________   LABS (all labs ordered are listed, but only abnormal results are displayed)  Labs Reviewed  CBC WITH DIFFERENTIAL/PLATELET - Abnormal; Notable for the following components:      Result Value   RBC 3.40 (*)     Hemoglobin 10.2 (*)    HCT 32.4 (*)    All other components within normal limits  PROTIME-INR - Abnormal; Notable for the following components:   Prothrombin Time 16.9 (*)    INR 1.4 (*)    All other components within normal limits  COMPREHENSIVE METABOLIC PANEL - Abnormal; Notable for the following components:   Glucose, Bld 119 (*)    Calcium 8.8 (*)    GFR calc non Af Amer 49 (*)    GFR calc Af Amer 57 (*)    All other components within normal limits  TROPONIN I  URINALYSIS, COMPLETE (UACMP) WITH MICROSCOPIC    Pertinent labs  results that were available during my care of the patient were reviewed by me and considered in my medical decision making (see chart for details). ____________________________________________  EKG  I personally interpreted any EKGs  ordered by me or triage Reviewed V paced rhythm baseline could be atrial flutter rate 73 ____________________________________________  RADIOLOGY  Pertinent labs & imaging results that were available during my care of the patient were reviewed by me and considered in my medical decision making (see chart for details). If possible, patient and/or family made aware of any abnormal findings.  Ct Head Wo Contrast  Result Date: 08/19/2018 CLINICAL DATA:  Fall.  Right orbital contusion EXAM: CT HEAD WITHOUT CONTRAST CT MAXILLOFACIAL WITHOUT CONTRAST CT CERVICAL SPINE WITHOUT CONTRAST TECHNIQUE: Multidetector CT imaging of the head, cervical spine, and maxillofacial structures were performed using the standard protocol without intravenous contrast. Multiplanar CT image reconstructions of the cervical spine and maxillofacial structures were also generated. COMPARISON:  CT head 07/12/2018 FINDINGS: CT HEAD FINDINGS Brain: Large mixed density subdural hemorrhage over the left frontal convexity measuring approximately 24 mm in thickness. Mixture of intermediate and high density blood products with mass-effect and mild midline shift. 6 mm  midline shift to the right. Small high-density subdural hematoma left frontal lobe above the orbit and extending anterior to the frontal lobe. Advanced atrophy. Ventricular enlargement consistent with atrophy. Chronic microvascular ischemic change in the white matter. No acute ischemic infarct Vascular: Negative for hyperdense vessel Skull: Negative for skull fracture Other: None CT MAXILLOFACIAL FINDINGS Osseous: Negative for facial fracture Orbits: Bilateral cataract surgery.  Negative for orbital mass. Sinuses: Clear Soft tissues: Soft tissue swelling around the right eye compatible with contusion. CT CERVICAL SPINE FINDINGS Alignment: Mild anterolisthesis C2-3 and C3-4. Prominent cervical kyphosis at C4-5. Mild-to-moderate levoscoliosis. Skull base and vertebrae: Negative for cervical spine fracture. Sclerotic lesion T2 vertebral body on the left is stable from prior studies and benign-appearing Soft tissues and spinal canal: Negative for mass or hematoma Disc levels: Multilevel disc degeneration and spurring throughout the cervical spine. Mild facet degeneration. Upper chest: Negative Other: None IMPRESSION: 1. 24 mm mixed density subdural hematoma left convexity with mass-effect and midline shift measuring 6 mm. Additional high-density small subdural hematoma left frontal lobe. Findings compatible with subacute and acute bleeding. 2. Atrophy and chronic microvascular ischemic changes throughout the white matter 3. Extensive cervical spondylosis. Negative for fracture cervical spine. Electronically Signed   By: Franchot Gallo M.D.   On: 08/19/2018 14:17   Ct Cervical Spine Wo Contrast  Result Date: 08/19/2018 CLINICAL DATA:  Fall.  Right orbital contusion EXAM: CT HEAD WITHOUT CONTRAST CT MAXILLOFACIAL WITHOUT CONTRAST CT CERVICAL SPINE WITHOUT CONTRAST TECHNIQUE: Multidetector CT imaging of the head, cervical spine, and maxillofacial structures were performed using the standard protocol without intravenous  contrast. Multiplanar CT image reconstructions of the cervical spine and maxillofacial structures were also generated. COMPARISON:  CT head 07/12/2018 FINDINGS: CT HEAD FINDINGS Brain: Large mixed density subdural hemorrhage over the left frontal convexity measuring approximately 24 mm in thickness. Mixture of intermediate and high density blood products with mass-effect and mild midline shift. 6 mm midline shift to the right. Small high-density subdural hematoma left frontal lobe above the orbit and extending anterior to the frontal lobe. Advanced atrophy. Ventricular enlargement consistent with atrophy. Chronic microvascular ischemic change in the white matter. No acute ischemic infarct Vascular: Negative for hyperdense vessel Skull: Negative for skull fracture Other: None CT MAXILLOFACIAL FINDINGS Osseous: Negative for facial fracture Orbits: Bilateral cataract surgery.  Negative for orbital mass. Sinuses: Clear Soft tissues: Soft tissue swelling around the right eye compatible with contusion. CT CERVICAL SPINE FINDINGS Alignment: Mild anterolisthesis C2-3 and C3-4. Prominent cervical kyphosis at C4-5.  Mild-to-moderate levoscoliosis. Skull base and vertebrae: Negative for cervical spine fracture. Sclerotic lesion T2 vertebral body on the left is stable from prior studies and benign-appearing Soft tissues and spinal canal: Negative for mass or hematoma Disc levels: Multilevel disc degeneration and spurring throughout the cervical spine. Mild facet degeneration. Upper chest: Negative Other: None IMPRESSION: 1. 24 mm mixed density subdural hematoma left convexity with mass-effect and midline shift measuring 6 mm. Additional high-density small subdural hematoma left frontal lobe. Findings compatible with subacute and acute bleeding. 2. Atrophy and chronic microvascular ischemic changes throughout the white matter 3. Extensive cervical spondylosis. Negative for fracture cervical spine. Electronically Signed   By:  Franchot Gallo M.D.   On: 08/19/2018 14:17   Dg Chest Port 1 View  Result Date: 08/19/2018 CLINICAL DATA:  Status post fall today. EXAM: PORTABLE CHEST 1 VIEW COMPARISON:  PA and lateral chest 02/14/2018. FINDINGS: Pacing device is in place. Mild subsegmental atelectasis in the lung bases is seen. Lungs otherwise clear. Heart size is enlarged. Aortic atherosclerosis noted. No acute or focal bony abnormality. IMPRESSION: No acute disease. Cardiomegaly. Atherosclerosis. Electronically Signed   By: Inge Rise M.D.   On: 08/19/2018 13:45   Ct Maxillofacial Wo Contrast  Result Date: 08/19/2018 CLINICAL DATA:  Fall.  Right orbital contusion EXAM: CT HEAD WITHOUT CONTRAST CT MAXILLOFACIAL WITHOUT CONTRAST CT CERVICAL SPINE WITHOUT CONTRAST TECHNIQUE: Multidetector CT imaging of the head, cervical spine, and maxillofacial structures were performed using the standard protocol without intravenous contrast. Multiplanar CT image reconstructions of the cervical spine and maxillofacial structures were also generated. COMPARISON:  CT head 07/12/2018 FINDINGS: CT HEAD FINDINGS Brain: Large mixed density subdural hemorrhage over the left frontal convexity measuring approximately 24 mm in thickness. Mixture of intermediate and high density blood products with mass-effect and mild midline shift. 6 mm midline shift to the right. Small high-density subdural hematoma left frontal lobe above the orbit and extending anterior to the frontal lobe. Advanced atrophy. Ventricular enlargement consistent with atrophy. Chronic microvascular ischemic change in the white matter. No acute ischemic infarct Vascular: Negative for hyperdense vessel Skull: Negative for skull fracture Other: None CT MAXILLOFACIAL FINDINGS Osseous: Negative for facial fracture Orbits: Bilateral cataract surgery.  Negative for orbital mass. Sinuses: Clear Soft tissues: Soft tissue swelling around the right eye compatible with contusion. CT CERVICAL SPINE  FINDINGS Alignment: Mild anterolisthesis C2-3 and C3-4. Prominent cervical kyphosis at C4-5. Mild-to-moderate levoscoliosis. Skull base and vertebrae: Negative for cervical spine fracture. Sclerotic lesion T2 vertebral body on the left is stable from prior studies and benign-appearing Soft tissues and spinal canal: Negative for mass or hematoma Disc levels: Multilevel disc degeneration and spurring throughout the cervical spine. Mild facet degeneration. Upper chest: Negative Other: None IMPRESSION: 1. 24 mm mixed density subdural hematoma left convexity with mass-effect and midline shift measuring 6 mm. Additional high-density small subdural hematoma left frontal lobe. Findings compatible with subacute and acute bleeding. 2. Atrophy and chronic microvascular ischemic changes throughout the white matter 3. Extensive cervical spondylosis. Negative for fracture cervical spine. Electronically Signed   By: Franchot Gallo M.D.   On: 08/19/2018 14:17   ____________________________________________    PROCEDURES  Procedure(s) performed: None  Procedures  Critical Care performed: CRITICAL CARE Performed by: Schuyler Amor   Total critical care time: 55 minutes  Critical care time was exclusive of separately billable procedures and treating other patients.  Critical care was necessary to treat or prevent imminent or life-threatening deterioration.  Critical care was time spent  personally by me on the following activities: development of treatment plan with patient and/or surrogate as well as nursing, discussions with consultants, evaluation of patient's response to treatment, examination of patient, obtaining history from patient or surrogate, ordering and performing treatments and interventions, ordering and review of laboratory studies, ordering and review of radiographic studies, pulse oximetry and re-evaluation of patient's condition.   ____________________________________________   INITIAL  IMPRESSION / ASSESSMENT AND PLAN / ED COURSE  Pertinent labs & imaging results that were available during my care of the patient were reviewed by me and considered in my medical decision making (see chart for details).  Patient in no acute distress, I did talk to all of the principal decision-makers and his family they do not think he is a candidate for surgery they want to make sure he is comfortable.  I also talked to Dr. Ellene Route, of surgery, appreciate the consult, he agrees with holding Eliquis, he does not feel that any intervention is indicated at this time in any event given the patient's age and he is reassured by the family's declaration of his DNR status.  I have talked to Dr. Posey Pronto, of the hospitalist service and we will admit for repeat CT scan in the morning.  Also Eliquis will be held.     ____________________________________________   FINAL CLINICAL IMPRESSION(S) / ED DIAGNOSES  Final diagnoses:  Fall      This chart was dictated using voice recognition software.  Despite best efforts to proofread,  errors can occur which can change meaning.      Schuyler Amor, MD 08/19/18 1459    Schuyler Amor, MD 08/19/18 [phone]

## 2018-08-19 NOTE — Progress Notes (Signed)
Patient tolerated in and out cath well. Abdomen much softer and not tender following procedure. 1086ml clear yellow urine removed.

## 2018-08-19 NOTE — ED Notes (Signed)
Pt taken to CT via stretcher.

## 2018-08-19 NOTE — ED Notes (Signed)
EDP on phone talking to pt family member

## 2018-08-19 NOTE — Progress Notes (Signed)
Advanced care plan.  Purpose of the Encounter: CODE STATUS  Parties in Attendance: Patient himself family over the phone  Patient's Decision Capacity: Not intact  Subjective/Patient's story:  William Ryan  is a 85 y.o. male with a known history of atrial fibrillation, anemia, GERD, hyperlipidemia, hypertension, history of pacemaker, prostate cancer, sick sinus syndrome who currently resides in [place].  Patient has a history of recurrent falls.  He fell yesterday this morning he was confused he was brought to the emergency room CT scan shows subdural hematoma.  Objective/Medical story  It was discussed with the family regarding their desires for aggressive management for this patient.  They wanted him to have no surgical intervention.  Want him to be a DNR.  No cardiac or pulmonary resuscitation.  There is a healthcare power of attorney.  Goals of care determination:   DNR  CODE STATUS: DNR   Time spent discussing advanced care planning: 16 minutes

## 2018-08-19 NOTE — ED Triage Notes (Addendum)
Pt arrives to ED via [facility] from [facility] for a fall. Fell last night around 6pm. Arrives confused to date but alert to place and name. Speaking in complete sentences. Moving all extremities on own. Has bruising around R eye. Arrives with glasses on, intact. States some extra blurred vision. Told EMS bilat knee pain. Has had bilat knee replacements. Abrasion to R knee. Takes eliquis. Grip strength weak. No facial droop. No active bleeding noted. Facility told EMS that pt was incontinent or urine today which is not normal. Pt arrives wet. Clothes removed. Given dry gown.

## 2018-08-19 NOTE — ED Notes (Signed)
ED TO INPATIENT HANDOFF REPORT  ED Nurse Name and Phone #:  Anda Kraft [phone]  S Name/Age/Gender William Ryan 85 y.o. male Room/Bed: ED02A/ED02A  Code Status   Code Status: Prior  Home/SNF/Other Nursing Home Patient oriented to: self Is this baseline? No   Triage Complete: Triage complete  Chief Complaint fall,altered mental status  Triage Note Pt arrives to ED via [facility] from [facility] for a fall. Fell last night around 6pm. Arrives confused to date but alert to place and name. Speaking in complete sentences. Moving all extremities on own. Has bruising around R eye. Arrives with glasses on, intact. States some extra blurred vision. Told EMS bilat knee pain. Has had bilat knee replacements. Abrasion to R knee. Takes eliquis. Grip strength weak. No facial droop. No active bleeding noted. Facility told EMS that pt was incontinent or urine today which is not normal. Pt arrives wet. Clothes removed. Given dry gown.    Allergies No Known Allergies  Level of Care/Admitting Diagnosis ED Disposition    ED Disposition Condition [facility] Area: [place] [[id number]]  Level of Care: Med-Surg [16]  Diagnosis: Subdural hematoma without coma [facility]) [[id number]]  Admitting Physician: Dustin Flock [[id number]]  Attending Physician: Dustin Flock [[id number]]  Estimated length of stay: past midnight tomorrow  Certification:: I certify this patient will need inpatient services for at least 2 midnights  PT Class (Do Not Modify): Inpatient [101]  PT Acc Code (Do Not Modify): Private [1]       B Medical/Surgery History Past Medical History:  Diagnosis Date  . A-fib ([place])   . Anemia   . GERD (gastroesophageal reflux disease)   . History of kidney stones   . HLD (hyperlipidemia)   . HTN (hypertension)   . Presence of permanent cardiac pacemaker   . Prostate cancer ([place])   . Sick sinus syndrome ([place])   . Urinary incontinence    Past Surgical History:   Procedure Laterality Date  . APPENDECTOMY    . EYE SURGERY     bilateral cataract  . INSERT / REPLACE / REMOVE PACEMAKER    . PACEMAKER INSERTION Left 02/15/2018   Procedure: CHANGE OUT SINGLE CHAMBER PACEMAKER;  Surgeon: Isaias Cowman, MD;  Location: [facility] ORS;  Service: Cardiovascular;  Laterality: Left;  . prostatectomy       A IV Location/Drains/Wounds Patient Lines/Drains/Airways Status   Active Line/Drains/Airways    Name:   Placement date:   Placement time:   Site:   Days:   Peripheral IV 08/19/18 Left Forearm   08/19/18    1333    Forearm   less than 1   Incision (Closed) 02/15/18 Shoulder Left   02/15/18    1219     185          Intake/Output Last 24 hours No intake or output data in the 24 hours ending 08/19/18 1551  Labs/Imaging Results for orders placed or performed during the hospital encounter of 08/19/18 (from the past 48 hour(s))  CBC with Differential     Status: Abnormal   Collection Time: 08/19/18  1:24 PM  Result Value Ref Range   WBC 9.1 4.0 - 10.5 K/uL   RBC 3.40 (L) 4.22 - 5.81 MIL/uL   Hemoglobin 10.2 (L) 13.0 - 17.0 g/dL   HCT 32.4 (L) 39.0 - 52.0 %   MCV 95.3 80.0 - 100.0 fL   MCH 30.0 26.0 - 34.0 pg   MCHC 31.5 30.0 - 36.0  g/dL   RDW 15.3 11.5 - 15.5 %   Platelets 179 150 - 400 K/uL   nRBC 0.0 0.0 - 0.2 %   Neutrophils Relative % 49 %   Neutro Abs 4.4 1.7 - 7.7 K/uL   Lymphocytes Relative 40 %   Lymphs Abs 3.7 0.7 - 4.0 K/uL   Monocytes Relative 9 %   Monocytes Absolute 0.8 0.1 - 1.0 K/uL   Eosinophils Relative 2 %   Eosinophils Absolute 0.2 0.0 - 0.5 K/uL   Basophils Relative 0 %   Basophils Absolute 0.0 0.0 - 0.1 K/uL   Immature Granulocytes 0 %   Abs Immature Granulocytes 0.04 0.00 - 0.07 K/uL    Comment: Performed at [facility], [address] [place], [place] [address]  Protime-INR     Status: Abnormal   Collection Time: 08/19/18  1:24 PM  Result Value Ref Range   Prothrombin Time 16.9 (H) 11.4 - 15.2 seconds    INR 1.4 (H) 0.8 - 1.2    Comment: (NOTE) INR goal varies based on device and disease states. Performed at [facility], [address] [place], [place] [address]   Comprehensive metabolic panel     Status: Abnormal   Collection Time: 08/19/18  1:24 PM  Result Value Ref Range   Sodium 135 135 - 145 mmol/L   Potassium 3.9 3.5 - 5.1 mmol/L   Chloride 103 98 - 111 mmol/L   CO2 25 22 - 32 mmol/L   Glucose, Bld 119 (H) 70 - 99 mg/dL   BUN 21 8 - 23 mg/dL   Creatinine, Ser 1.24 0.61 - 1.24 mg/dL   Calcium 8.8 (L) 8.9 - 10.3 mg/dL   Total Protein 6.6 6.5 - 8.1 g/dL   Albumin 3.8 3.5 - 5.0 g/dL   AST 27 15 - 41 U/L   ALT 12 0 - 44 U/L   Alkaline Phosphatase 67 38 - 126 U/L   Total Bilirubin 0.7 0.3 - 1.2 mg/dL   GFR calc non Af Amer 49 (L) >60 mL/min   GFR calc Af Amer 57 (L) >60 mL/min   Anion gap 7 5 - 15    Comment: Performed at [facility], [address] [place], [place] [address]  Troponin I - Once     Status: None   Collection Time: 08/19/18  1:24 PM  Result Value Ref Range   Troponin I <0.03 <0.03 ng/mL    Comment: Performed at [facility], [address] [place], [place] [address]   Ct Head Wo Contrast  Result Date: 08/19/2018 CLINICAL DATA:  Fall.  Right orbital contusion EXAM: CT HEAD WITHOUT CONTRAST CT MAXILLOFACIAL WITHOUT CONTRAST CT CERVICAL SPINE WITHOUT CONTRAST TECHNIQUE: Multidetector CT imaging of the head, cervical spine, and maxillofacial structures were performed using the standard protocol without intravenous contrast. Multiplanar CT image reconstructions of the cervical spine and maxillofacial structures were also generated. COMPARISON:  CT head 07/12/2018 FINDINGS: CT HEAD FINDINGS Brain: Large mixed density subdural hemorrhage over the left frontal convexity measuring approximately 24 mm in thickness. Mixture of intermediate and high density blood products with mass-effect and mild midline shift. 6 mm midline shift to the  right. Small high-density subdural hematoma left frontal lobe above the orbit and extending anterior to the frontal lobe. Advanced atrophy. Ventricular enlargement consistent with atrophy. Chronic microvascular ischemic change in the white matter. No acute ischemic infarct Vascular: Negative for hyperdense vessel Skull: Negative for skull fracture Other: None CT MAXILLOFACIAL FINDINGS  Osseous: Negative for facial fracture Orbits: Bilateral cataract surgery.  Negative for orbital mass. Sinuses: Clear Soft tissues: Soft tissue swelling around the right eye compatible with contusion. CT CERVICAL SPINE FINDINGS Alignment: Mild anterolisthesis C2-3 and C3-4. Prominent cervical kyphosis at C4-5. Mild-to-moderate levoscoliosis. Skull base and vertebrae: Negative for cervical spine fracture. Sclerotic lesion T2 vertebral body on the left is stable from prior studies and benign-appearing Soft tissues and spinal canal: Negative for mass or hematoma Disc levels: Multilevel disc degeneration and spurring throughout the cervical spine. Mild facet degeneration. Upper chest: Negative Other: None IMPRESSION: 1. 24 mm mixed density subdural hematoma left convexity with mass-effect and midline shift measuring 6 mm. Additional high-density small subdural hematoma left frontal lobe. Findings compatible with subacute and acute bleeding. 2. Atrophy and chronic microvascular ischemic changes throughout the white matter 3. Extensive cervical spondylosis. Negative for fracture cervical spine. Electronically Signed   By: Franchot Gallo M.D.   On: 08/19/2018 14:17   Ct Cervical Spine Wo Contrast  Result Date: 08/19/2018 CLINICAL DATA:  Fall.  Right orbital contusion EXAM: CT HEAD WITHOUT CONTRAST CT MAXILLOFACIAL WITHOUT CONTRAST CT CERVICAL SPINE WITHOUT CONTRAST TECHNIQUE: Multidetector CT imaging of the head, cervical spine, and maxillofacial structures were performed using the standard protocol without intravenous contrast.  Multiplanar CT image reconstructions of the cervical spine and maxillofacial structures were also generated. COMPARISON:  CT head 07/12/2018 FINDINGS: CT HEAD FINDINGS Brain: Large mixed density subdural hemorrhage over the left frontal convexity measuring approximately 24 mm in thickness. Mixture of intermediate and high density blood products with mass-effect and mild midline shift. 6 mm midline shift to the right. Small high-density subdural hematoma left frontal lobe above the orbit and extending anterior to the frontal lobe. Advanced atrophy. Ventricular enlargement consistent with atrophy. Chronic microvascular ischemic change in the white matter. No acute ischemic infarct Vascular: Negative for hyperdense vessel Skull: Negative for skull fracture Other: None CT MAXILLOFACIAL FINDINGS Osseous: Negative for facial fracture Orbits: Bilateral cataract surgery.  Negative for orbital mass. Sinuses: Clear Soft tissues: Soft tissue swelling around the right eye compatible with contusion. CT CERVICAL SPINE FINDINGS Alignment: Mild anterolisthesis C2-3 and C3-4. Prominent cervical kyphosis at C4-5. Mild-to-moderate levoscoliosis. Skull base and vertebrae: Negative for cervical spine fracture. Sclerotic lesion T2 vertebral body on the left is stable from prior studies and benign-appearing Soft tissues and spinal canal: Negative for mass or hematoma Disc levels: Multilevel disc degeneration and spurring throughout the cervical spine. Mild facet degeneration. Upper chest: Negative Other: None IMPRESSION: 1. 24 mm mixed density subdural hematoma left convexity with mass-effect and midline shift measuring 6 mm. Additional high-density small subdural hematoma left frontal lobe. Findings compatible with subacute and acute bleeding. 2. Atrophy and chronic microvascular ischemic changes throughout the white matter 3. Extensive cervical spondylosis. Negative for fracture cervical spine. Electronically Signed   By: Franchot Gallo  M.D.   On: 08/19/2018 14:17   Dg Chest Port 1 View  Result Date: 08/19/2018 CLINICAL DATA:  Status post fall today. EXAM: PORTABLE CHEST 1 VIEW COMPARISON:  PA and lateral chest 02/14/2018. FINDINGS: Pacing device is in place. Mild subsegmental atelectasis in the lung bases is seen. Lungs otherwise clear. Heart size is enlarged. Aortic atherosclerosis noted. No acute or focal bony abnormality. IMPRESSION: No acute disease. Cardiomegaly. Atherosclerosis. Electronically Signed   By: Inge Rise M.D.   On: 08/19/2018 13:45   Ct Maxillofacial Wo Contrast  Result Date: 08/19/2018 CLINICAL DATA:  Fall.  Right orbital contusion EXAM: CT HEAD  WITHOUT CONTRAST CT MAXILLOFACIAL WITHOUT CONTRAST CT CERVICAL SPINE WITHOUT CONTRAST TECHNIQUE: Multidetector CT imaging of the head, cervical spine, and maxillofacial structures were performed using the standard protocol without intravenous contrast. Multiplanar CT image reconstructions of the cervical spine and maxillofacial structures were also generated. COMPARISON:  CT head 07/12/2018 FINDINGS: CT HEAD FINDINGS Brain: Large mixed density subdural hemorrhage over the left frontal convexity measuring approximately 24 mm in thickness. Mixture of intermediate and high density blood products with mass-effect and mild midline shift. 6 mm midline shift to the right. Small high-density subdural hematoma left frontal lobe above the orbit and extending anterior to the frontal lobe. Advanced atrophy. Ventricular enlargement consistent with atrophy. Chronic microvascular ischemic change in the white matter. No acute ischemic infarct Vascular: Negative for hyperdense vessel Skull: Negative for skull fracture Other: None CT MAXILLOFACIAL FINDINGS Osseous: Negative for facial fracture Orbits: Bilateral cataract surgery.  Negative for orbital mass. Sinuses: Clear Soft tissues: Soft tissue swelling around the right eye compatible with contusion. CT CERVICAL SPINE FINDINGS Alignment:  Mild anterolisthesis C2-3 and C3-4. Prominent cervical kyphosis at C4-5. Mild-to-moderate levoscoliosis. Skull base and vertebrae: Negative for cervical spine fracture. Sclerotic lesion T2 vertebral body on the left is stable from prior studies and benign-appearing Soft tissues and spinal canal: Negative for mass or hematoma Disc levels: Multilevel disc degeneration and spurring throughout the cervical spine. Mild facet degeneration. Upper chest: Negative Other: None IMPRESSION: 1. 24 mm mixed density subdural hematoma left convexity with mass-effect and midline shift measuring 6 mm. Additional high-density small subdural hematoma left frontal lobe. Findings compatible with subacute and acute bleeding. 2. Atrophy and chronic microvascular ischemic changes throughout the white matter 3. Extensive cervical spondylosis. Negative for fracture cervical spine. Electronically Signed   By: Franchot Gallo M.D.   On: 08/19/2018 14:17    Pending Labs Unresulted Labs (From admission, onward)    Start     Ordered   08/19/18 1322  Urinalysis, Complete w Microscopic  ONCE - STAT,   STAT     08/19/18 1321          Vitals/Pain Today's Vitals   08/19/18 1430 08/19/18 1500 08/19/18 1514 08/19/18 1530  BP: (!) 147/103 (!) 132/111  (!) 142/122  Pulse:      Resp: 18 18  18   Temp:      TempSrc:      SpO2:      Weight:      Height:      PainSc:   Asleep     Isolation Precautions No active isolations  Medications Medications - No data to display  Mobility Unsure. Believed not to walk.  High fall risk   Focused Assessments Skin- bruising to R eye with swelling    R Recommendations: See Admitting Provider Note  Report given to:   Additional Notes:

## 2018-08-20 ENCOUNTER — Inpatient Hospital Stay: Payer: Medicare Other

## 2018-08-20 MED ORDER — TAMSULOSIN HCL 0.4 MG PO CAPS
0.4000 mg | ORAL_CAPSULE | Freq: Every day | ORAL | Status: DC
  Administered 2018-08-20 – 2018-08-25 (×6): 0.4 mg via ORAL
  Filled 2018-08-20 (×6): qty 1

## 2018-08-20 NOTE — Progress Notes (Signed)
Patient was able to void this am but still had pvr of 400. Per Dr. Jannifer Franklin if patient unable to void and bladder scan was greater than 400 we needed to place a indwelling catheter. Patient will continue to be monitored to see if he can continue to void. He appears more comfortable this morning and abdomen remains soft.

## 2018-08-20 NOTE — Progress Notes (Signed)
Requires sitter 1:1 due to confusion, impulsiveness to get OOB, pulling at IV lines. Sitter performing all comfort measures and feeding pt. Pt can move and turn self. Flomax started this a.m. to aide with urinary flow and ability to empty bladder. Voiding 50-200 ml urine in urinal. Sleeping at long intervals currently. Will rescan bladder this afternoon.

## 2018-08-20 NOTE — NC FL2 (Signed)
[place] LEVEL OF CARE SCREENING TOOL     IDENTIFICATION  Patient Name: William Ryan Birthdate: [birth year] Sex: male Admission Date (Current Location): 08/19/2018  [place] and [place] Number:  Engineering geologist and Address:  [facility], [address] [place], [place] [address]      Provider Number: [id number]  Attending Physician Name and Address:  Hillary Bow, MD  Relative Name and Phone Number:  [name] (Daughter) [phone] or Keigan Tafoya [facility]) [phone]    Current Level of Care: Hospital Recommended Level of Care: [place] Prior Approval Number:    Date Approved/Denied:   PASRR Number:    Discharge Plan: Domiciliary (Rest home)    Current Diagnoses: Patient Active Problem List   Diagnosis Date Noted  . Subdural hematoma without coma ([place]) 08/19/2018  . Absence of bladder continence 04/28/2014  . Acute CVA (cerebrovascular accident) ([place]) 04/28/2014  . Essential hypertension 04/17/2014  . Parkinson's disease dementia ([place]) 04/17/2014  . Paroxysmal A-fib ([facility]) 04/17/2014    Orientation RESPIRATION BLADDER Height & Weight     Self  Normal Incontinent Weight: 211 lb (95.7 kg) Height:  5\' 11"  (180.3 cm)  BEHAVIORAL SYMPTOMS/MOOD NEUROLOGICAL BOWEL NUTRITION STATUS      Continent Diet(Heart healthy)  AMBULATORY STATUS COMMUNICATION OF NEEDS Skin   Supervision Verbally Bruising                       Personal Care Assistance Level of Assistance  Bathing, Feeding, Dressing Bathing Assistance: Limited assistance Feeding assistance: Independent Dressing Assistance: Limited assistance     Functional Limitations Info  Sight, Hearing, Speech Sight Info: Impaired Hearing Info: Adequate Speech Info: Adequate    SPECIAL CARE FACTORS FREQUENCY                       Contractures Contractures Info: Not present    Additional Factors Info  Code Status, Allergies Code  Status Info: DNR Allergies Info: No Known Allergies           Current Medications (08/20/2018):  This is the current hospital active medication list Current Facility-Administered Medications  Medication Dose Route Frequency Provider Last Rate Last Dose  . 0.9 %  sodium chloride infusion  250 mL Intravenous PRN Dustin Flock, MD      . 0.9 %  sodium chloride infusion   Intravenous Continuous Dustin Flock, MD 50 mL/hr at 08/20/18 1250    . acetaminophen (TYLENOL) tablet 975 mg  975 mg Oral Q8H PRN Dustin Flock, MD      . hydrALAZINE (APRESOLINE) injection 10 mg  10 mg Intravenous Q6H PRN Dustin Flock, MD      . ondansetron (ZOFRAN) tablet 4 mg  4 mg Oral Q6H PRN Dustin Flock, MD       Or  . ondansetron (ZOFRAN) injection 4 mg  4 mg Intravenous Q6H PRN Dustin Flock, MD      . pantoprazole (PROTONIX) EC tablet 40 mg  40 mg Oral Daily Dustin Flock, MD   40 mg at 08/20/18 8413  . polyethylene glycol (MIRALAX / GLYCOLAX) packet 17 g  17 g Oral Daily PRN Dustin Flock, MD      . sodium chloride flush (NS) 0.9 % injection 3 mL  3 mL Intravenous Q12H Dustin Flock, MD      . sodium chloride flush (NS) 0.9 % injection 3 mL  3 mL Intravenous PRN Dustin Flock, MD      .  tamsulosin (FLOMAX) capsule 0.4 mg  0.4 mg Oral Daily Hillary Bow, MD   0.4 mg at 08/20/18 0923  . traMADol (ULTRAM) tablet 50 mg  50 mg Oral Q6H PRN Dustin Flock, MD   50 mg at 08/19/18 2116     Discharge Medications: Please see discharge summary for a list of discharge medications.  Relevant Imaging Results:  Relevant Lab Results:   Additional Information SS#  Zettie Pho, LCSW

## 2018-08-20 NOTE — Progress Notes (Signed)
[facility] NAME: Jayvon Mounger    MR#:  [id number]  DATE OF BIRTH:  06-07-1921  SUBJECTIVE:  CHIEF COMPLAINT:   Chief Complaint  Patient presents with  . Fall   Confused.  Sitter at bedside.  Awake and alert  REVIEW OF SYSTEMS:    Review of Systems  Unable to perform ROS: Mental status change    DRUG ALLERGIES:  No Known Allergies  VITALS:  Blood pressure (!) 158/77, pulse 70, temperature 98.4 F (36.9 C), temperature source Oral, resp. rate 16, height 5\' 11"  (1.803 m), weight 95.7 kg, SpO2 95 %.  PHYSICAL EXAMINATION:   Physical Exam  GENERAL:  85 y.o.-year-old patient lying in the bed with no acute distress.  EYES: Pupils equal, round, reactive to light and accommodation. No scleral icterus. Extraocular muscles intact.  HEENT: Head atraumatic, normocephalic. Oropharynx and nasopharynx clear.  NECK:  Supple, no jugular venous distention. No thyroid enlargement, no tenderness.  LUNGS: Normal breath sounds bilaterally, no wheezing, rales, rhonchi. No use of accessory muscles of respiration.  CARDIOVASCULAR: S1, S2 normal. No murmurs, rubs, or gallops.  ABDOMEN: Soft, nontender, nondistended. Bowel sounds present. No organomegaly or mass.  EXTREMITIES: No cyanosis, clubbing or edema b/l.    NEUROLOGIC: Moves all 4 extremities.  Motor strength 4/5 in upper and lower extremities. PSYCHIATRIC: The patient is alert and awake.  Pleasantly confused SKIN: No obvious rash, lesion, or ulcer.   LABORATORY PANEL:   CBC Recent Labs  Lab 08/19/18 1324  WBC 9.1  HGB 10.2*  HCT 32.4*  PLT 179   ------------------------------------------------------------------------------------------------------------------ Chemistries  Recent Labs  Lab 08/19/18 1324  NA 135  K 3.9  CL 103  CO2 25  GLUCOSE 119*  BUN 21  CREATININE 1.24  CALCIUM 8.8*  AST 27  ALT 12  ALKPHOS 67  BILITOT 0.7    ------------------------------------------------------------------------------------------------------------------  Cardiac Enzymes Recent Labs  Lab 08/19/18 1324  TROPONINI <0.03   ------------------------------------------------------------------------------------------------------------------  RADIOLOGY:  Ct Head Wo Contrast  Result Date: 08/19/2018 CLINICAL DATA:  Fall.  Right orbital contusion EXAM: CT HEAD WITHOUT CONTRAST CT MAXILLOFACIAL WITHOUT CONTRAST CT CERVICAL SPINE WITHOUT CONTRAST TECHNIQUE: Multidetector CT imaging of the head, cervical spine, and maxillofacial structures were performed using the standard protocol without intravenous contrast. Multiplanar CT image reconstructions of the cervical spine and maxillofacial structures were also generated. COMPARISON:  CT head 07/12/2018 FINDINGS: CT HEAD FINDINGS Brain: Large mixed density subdural hemorrhage over the left frontal convexity measuring approximately 24 mm in thickness. Mixture of intermediate and high density blood products with mass-effect and mild midline shift. 6 mm midline shift to the right. Small high-density subdural hematoma left frontal lobe above the orbit and extending anterior to the frontal lobe. Advanced atrophy. Ventricular enlargement consistent with atrophy. Chronic microvascular ischemic change in the white matter. No acute ischemic infarct Vascular: Negative for hyperdense vessel Skull: Negative for skull fracture Other: None CT MAXILLOFACIAL FINDINGS Osseous: Negative for facial fracture Orbits: Bilateral cataract surgery.  Negative for orbital mass. Sinuses: Clear Soft tissues: Soft tissue swelling around the right eye compatible with contusion. CT CERVICAL SPINE FINDINGS Alignment: Mild anterolisthesis C2-3 and C3-4. Prominent cervical kyphosis at C4-5. Mild-to-moderate levoscoliosis. Skull base and vertebrae: Negative for cervical spine fracture. Sclerotic lesion T2 vertebral body on the left is  stable from prior studies and benign-appearing Soft tissues and spinal canal: Negative for mass or hematoma Disc levels: Multilevel disc degeneration and spurring throughout the cervical spine. Mild  facet degeneration. Upper chest: Negative Other: None IMPRESSION: 1. 24 mm mixed density subdural hematoma left convexity with mass-effect and midline shift measuring 6 mm. Additional high-density small subdural hematoma left frontal lobe. Findings compatible with subacute and acute bleeding. 2. Atrophy and chronic microvascular ischemic changes throughout the white matter 3. Extensive cervical spondylosis. Negative for fracture cervical spine. Electronically Signed   By: Franchot Gallo M.D.   On: 08/19/2018 14:17   Ct Cervical Spine Wo Contrast  Result Date: 08/19/2018 CLINICAL DATA:  Fall.  Right orbital contusion EXAM: CT HEAD WITHOUT CONTRAST CT MAXILLOFACIAL WITHOUT CONTRAST CT CERVICAL SPINE WITHOUT CONTRAST TECHNIQUE: Multidetector CT imaging of the head, cervical spine, and maxillofacial structures were performed using the standard protocol without intravenous contrast. Multiplanar CT image reconstructions of the cervical spine and maxillofacial structures were also generated. COMPARISON:  CT head 07/12/2018 FINDINGS: CT HEAD FINDINGS Brain: Large mixed density subdural hemorrhage over the left frontal convexity measuring approximately 24 mm in thickness. Mixture of intermediate and high density blood products with mass-effect and mild midline shift. 6 mm midline shift to the right. Small high-density subdural hematoma left frontal lobe above the orbit and extending anterior to the frontal lobe. Advanced atrophy. Ventricular enlargement consistent with atrophy. Chronic microvascular ischemic change in the white matter. No acute ischemic infarct Vascular: Negative for hyperdense vessel Skull: Negative for skull fracture Other: None CT MAXILLOFACIAL FINDINGS Osseous: Negative for facial fracture Orbits:  Bilateral cataract surgery.  Negative for orbital mass. Sinuses: Clear Soft tissues: Soft tissue swelling around the right eye compatible with contusion. CT CERVICAL SPINE FINDINGS Alignment: Mild anterolisthesis C2-3 and C3-4. Prominent cervical kyphosis at C4-5. Mild-to-moderate levoscoliosis. Skull base and vertebrae: Negative for cervical spine fracture. Sclerotic lesion T2 vertebral body on the left is stable from prior studies and benign-appearing Soft tissues and spinal canal: Negative for mass or hematoma Disc levels: Multilevel disc degeneration and spurring throughout the cervical spine. Mild facet degeneration. Upper chest: Negative Other: None IMPRESSION: 1. 24 mm mixed density subdural hematoma left convexity with mass-effect and midline shift measuring 6 mm. Additional high-density small subdural hematoma left frontal lobe. Findings compatible with subacute and acute bleeding. 2. Atrophy and chronic microvascular ischemic changes throughout the white matter 3. Extensive cervical spondylosis. Negative for fracture cervical spine. Electronically Signed   By: Franchot Gallo M.D.   On: 08/19/2018 14:17   Dg Chest Port 1 View  Result Date: 08/19/2018 CLINICAL DATA:  Status post fall today. EXAM: PORTABLE CHEST 1 VIEW COMPARISON:  PA and lateral chest 02/14/2018. FINDINGS: Pacing device is in place. Mild subsegmental atelectasis in the lung bases is seen. Lungs otherwise clear. Heart size is enlarged. Aortic atherosclerosis noted. No acute or focal bony abnormality. IMPRESSION: No acute disease. Cardiomegaly. Atherosclerosis. Electronically Signed   By: Inge Rise M.D.   On: 08/19/2018 13:45   Ct Maxillofacial Wo Contrast  Result Date: 08/19/2018 CLINICAL DATA:  Fall.  Right orbital contusion EXAM: CT HEAD WITHOUT CONTRAST CT MAXILLOFACIAL WITHOUT CONTRAST CT CERVICAL SPINE WITHOUT CONTRAST TECHNIQUE: Multidetector CT imaging of the head, cervical spine, and maxillofacial structures were  performed using the standard protocol without intravenous contrast. Multiplanar CT image reconstructions of the cervical spine and maxillofacial structures were also generated. COMPARISON:  CT head 07/12/2018 FINDINGS: CT HEAD FINDINGS Brain: Large mixed density subdural hemorrhage over the left frontal convexity measuring approximately 24 mm in thickness. Mixture of intermediate and high density blood products with mass-effect and mild midline shift. 6 mm midline shift to  the right. Small high-density subdural hematoma left frontal lobe above the orbit and extending anterior to the frontal lobe. Advanced atrophy. Ventricular enlargement consistent with atrophy. Chronic microvascular ischemic change in the white matter. No acute ischemic infarct Vascular: Negative for hyperdense vessel Skull: Negative for skull fracture Other: None CT MAXILLOFACIAL FINDINGS Osseous: Negative for facial fracture Orbits: Bilateral cataract surgery.  Negative for orbital mass. Sinuses: Clear Soft tissues: Soft tissue swelling around the right eye compatible with contusion. CT CERVICAL SPINE FINDINGS Alignment: Mild anterolisthesis C2-3 and C3-4. Prominent cervical kyphosis at C4-5. Mild-to-moderate levoscoliosis. Skull base and vertebrae: Negative for cervical spine fracture. Sclerotic lesion T2 vertebral body on the left is stable from prior studies and benign-appearing Soft tissues and spinal canal: Negative for mass or hematoma Disc levels: Multilevel disc degeneration and spurring throughout the cervical spine. Mild facet degeneration. Upper chest: Negative Other: None IMPRESSION: 1. 24 mm mixed density subdural hematoma left convexity with mass-effect and midline shift measuring 6 mm. Additional high-density small subdural hematoma left frontal lobe. Findings compatible with subacute and acute bleeding. 2. Atrophy and chronic microvascular ischemic changes throughout the white matter 3. Extensive cervical spondylosis. Negative for  fracture cervical spine. Electronically Signed   By: Franchot Gallo M.D.   On: 08/19/2018 14:17     ASSESSMENT AND PLAN:   Patient is 85 year old presenting after a fall  1.  Subdural hematoma DNR/DNI.  Family requested no aggressive care or surgeries. Waiting for repeat CT scan of the head ordered for 2 PM today. Patient continues to be confused with dementia Poor prognosis.  2.  Accelerated hypertension Hydralazine  3.  History atrial fibrillation discontinued Eliquis would not use this medication in this patient with recurrent falls on discharge  4.  GERD continue Protonix  5.  Miscellaneous SCDs for DVT prophylaxis  All the records are reviewed and case discussed with Care Management/Social Worker Management plans discussed with the patient, family and they are in agreement.  CODE STATUS: DNR  DVT Prophylaxis: SCDs  TOTAL TIME TAKING CARE OF THIS PATIENT: 30 minutes.   POSSIBLE D/C IN 1-2 DAYS, DEPENDING ON CLINICAL CONDITION.  Leia Alf Haitham Dolinsky M.D on 08/20/2018 at 12:07 PM  Between 7am to 6pm - Pager - [phone]  After 6pm go to [email or web address] - password [email or web address] [facility]  Office  [phone]  CC: Primary care physician; Idelle Crouch, MD  Note: This dictation was prepared with Dragon dictation along with smaller phrase technology. Any transcriptional errors that result from this process are unintentional.

## 2018-08-21 MED ORDER — POLYETHYLENE GLYCOL 3350 17 G PO PACK
17.0000 g | PACK | Freq: Every day | ORAL | Status: DC
  Administered 2018-08-21 – 2018-08-23 (×2): 17 g via ORAL
  Filled 2018-08-21 (×2): qty 1

## 2018-08-21 NOTE — Progress Notes (Signed)
Pt continues to need a 1:1 safety sitter due to confusion, impulsiveness and pulling at lines.  Pt using the urinal for relief and sitter aides with those needs and feeding.

## 2018-08-22 DIAGNOSIS — Z515 Encounter for palliative care: Secondary | ICD-10-CM

## 2018-08-22 DIAGNOSIS — S065X9A Traumatic subdural hemorrhage with loss of consciousness of unspecified duration, initial encounter: Principal | ICD-10-CM

## 2018-08-22 DIAGNOSIS — Z66 Do not resuscitate: Secondary | ICD-10-CM

## 2018-08-22 MED ORDER — AMLODIPINE BESYLATE 10 MG PO TABS
10.0000 mg | ORAL_TABLET | Freq: Every day | ORAL | Status: DC
  Administered 2018-08-22 – 2018-08-25 (×4): 10 mg via ORAL
  Filled 2018-08-22 (×4): qty 1

## 2018-08-22 MED ORDER — QUETIAPINE FUMARATE 25 MG PO TABS
25.0000 mg | ORAL_TABLET | Freq: Every day | ORAL | Status: DC
  Administered 2018-08-22 – 2018-08-24 (×3): 25 mg via ORAL
  Filled 2018-08-22 (×3): qty 1

## 2018-08-22 NOTE — Progress Notes (Signed)
Md notified of temp 100.5, tylenol given. Will reassess.

## 2018-08-22 NOTE — Consult Note (Signed)
Consultation Note Date: 08/22/2018   Patient Name: William Ryan  DOB: 1922/04/20  MRN: [id number]  Age / Sex: 85 y.o., male  PCP: Idelle Crouch, MD Referring Physician: Hillary Bow, MD  Reason for Consultation: Establishing goals of care  HPI/Patient Profile: 85 y.o. male admitted on 08/19/2018 from [facility] with complaints of increased confusion after falling the day prior. He has a past medical history anemia, atrial fibrillation, GERD, hyperlipidemia, hypertension, pacemaker, prostate cancer, and sick sinus syndrome.  During his ED course head CT showed subdural hematoma.  Patient's family confirmed DNR and wishes were no aggressive medical interventions.  This admission repeat CT scan showed progression into intravesicular space.  Patient continues to show signs of confusion.  Palliative medicine team consulted for goals of care.  Clinical Assessment and Goals of Care: I have reviewed medical records including lab results, imaging, Epic notes, and MAR, received report from the bedside RN, and assessed the patient. I spoke with patient's daughter, [name] [facility]) to discuss diagnosis prognosis, [place], EOL wishes, disposition and options.  Patient is awake and alert to self only.  Dementia at baseline.  I introduced Palliative Medicine as specialized medical care for people living with serious illness. It focuses on providing relief from the symptoms and stress of a serious illness. The goal is to improve quality of life for both the patient and the family.  We discussed a brief life review of the patient.  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  He has 3 children and unfortunately the oldest son passed away several years ago.  Patient enjoyed sports and spending time with family.  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  Prior to placement  there he was a resident at [facility] independent living.  As far as functional and nutritional status daughter reports patient has had recurrent falls over the past month.  He has struggled with dementia for some time and family has noticed a decrease in quality of life and overall function.  She reports a noticeable worsening of memory.  Daughter states over the past 3 to 4 months patient is unable to have decent conversations due to the confusion.  Although he has had recurrent falls he was ambulatory with standby assist and walker.  She reports over the past 2 to 3 weeks facility workers have discussed a decrease in appetite and patient also having and inability to feed self with spoon and observe eating only with his fingers.  Also reports patient has began taking more naps throughout the day. Appetite has also decreased but remained fair. She states he has not had a problem with drinking moreso food intake.   We discussed his current illness and what it means in the larger context of his on-going co-morbidities.  Natural disease trajectory and expectations at EOL were discussed.  Discussed specifically his worsening dementia, recurrent falls and increased risk of serious injury, and his overall functional state.  Daughter verbalized understanding.  Expressed her  and her brother have been in conversations prior and recently since hospitalized and does not want their father to suffer.  She reports they are hopeful he can return to the facility, may be into their memory care unit.  I attempted to elicit values and goals of care important to the patient.    The difference between aggressive medical intervention and comfort care was considered in light of the patient's goals of care. Benjamine Mola states that her or her brother would like aggressive medical interventions.  The goals are to continue to treat the treatable with hopes patient can be discharged back to facility.  Again she verbalizes they are  aware of his decline and are realistic when discussed patient's poor prognosis.  Benjamine Mola patient's documented medical decision maker/POA.  She confirms DNR/DNI, no aggressive medical interventions, no escalation of care, and no artificial feedings.   Hospice and Palliative Care services outpatient were explained and offered. Benjamine Mola verbalized their understanding and awareness of hospice's goals and philosophy of care.  She is requesting patient be followed by community hospice once discharged.  She expresses her and her family's goal is for him to not suffer and they are not interested and frequent rehospitalization but more so treating in place once discharged.  She is aware once patient returns to facility his outpatient hospice nurse can assist with completion of MOLST form however documentation will occur to express her wishes a more of a comfort approach while returning to facility.  Questions and concerns were addressed. The family was encouraged to call with questions or concerns.  PMT will continue to support holistically.  Primary Decision Maker: Cain Saupe (Daughter)    SUMMARY OF RECOMMENDATIONS    DNR/DNI-as confirmed by daughter  Continue to treat  No aggressive medical interventions or escalation of care  Community hospice at discharge, daughter hopeful he can return to [facility]  Daughter expressed goal is not to continue with repetitive hospitalizations but for comfort, treat in place once discharged  CSW referral for assistance with disposition options Kandis Mannan) and outpatient hospice  PMT will continue to follow and support  Code Status/Advance Care Planning:  DNR/DNI    Symptom Management:   Per attending   Palliative Prophylaxis:   Aspiration, Bowel Regimen, Delirium Protocol and Frequent Pain Assessment  Additional Recommendations (Limitations, Scope, Preferences):  Full Scope Treatment, no escalation of care or  aggressive interventions   Psycho-social/Spiritual:   Desire for further Chaplaincy support:NO   Prognosis:   < 6 months in the setting of advanced dementia, decreased appetite, subdural hematoma s/p fall with progression, hypertension, prostace cancer, sick sinus, atrial fibrillation, and GERD.   Discharge Planning: Happy with Hospice      Primary Diagnoses: Present on Admission:  Subdural hematoma without coma ([place])   I have reviewed the medical record, interviewed the patient and family, and examined the patient. The following aspects are pertinent.  Past Medical History:  Diagnosis Date   A-fib ([place])    Anemia    GERD (gastroesophageal reflux disease)    History of kidney stones    HLD (hyperlipidemia)    HTN (hypertension)    Presence of permanent cardiac pacemaker    Prostate cancer (H[facility])    Sick sinus syndrome ([facility])    Urinary incontinence    Social History   Socioeconomic History   Marital status: Married    Spouse name: Not on file   Number of children: Not on file   Years of  education: Not on file   Highest education level: Not on file  Occupational History   Not on file  Social Needs   Financial resource strain: Not on file   Food insecurity:    Worry: Not on file    Inability: Not on file   Transportation needs:    Medical: Not on file    Non-medical: Not on file  Tobacco Use   Smoking status: Former Smoker   Smokeless tobacco: Never Used  Substance and Sexual Activity   Alcohol use: Not Currently    Alcohol/week: 0.0 standard drinks    Frequency: Never   Drug use: Never   Sexual activity: Not on file  Lifestyle   Physical activity:    Days per week: Not on file    Minutes per session: Not on file   Stress: Not on file  Relationships   Social connections:    Talks on phone: Not on file    Gets together: Not on file    Attends religious service: Not on file    Active member of club or  organization: Not on file    Attends meetings of clubs or organizations: Not on file    Relationship status: Not on file  Other Topics Concern   Not on file  Social History Narrative   Not on file   History reviewed. No pertinent family history. Scheduled Meds:  pantoprazole  40 mg Oral Daily   polyethylene glycol  17 g Oral Daily   sodium chloride flush  3 mL Intravenous Q12H   tamsulosin  0.4 mg Oral Daily   Continuous Infusions:  sodium chloride     sodium chloride 50 mL/hr at 08/22/18 0820   PRN Meds:.sodium chloride, acetaminophen, hydrALAZINE, ondansetron **OR** ondansetron (ZOFRAN) IV, sodium chloride flush, traMADol Medications Prior to Admission:  Prior to Admission medications   Medication Sig Start Date End Date Taking? Authorizing Provider  acetaminophen (TYLENOL) 650 MG CR tablet Take 1,300 mg by mouth every 8 (eight) hours as needed for pain.   Yes [provider]  apixaban (ELIQUIS) 2.5 MG TABS tablet Take 2.5 mg by mouth 2 (two) times daily.    Yes [provider]  aspirin 81 MG tablet Take 81 mg by mouth 2 (two) times daily.    Yes [provider]  oxybutynin (DITROPAN) 5 MG tablet Take 5 mg by mouth 2 (two) times daily.  01/18/18  Yes [provider]  pantoprazole (PROTONIX) 40 MG tablet Take 40 mg by mouth daily.    Yes [provider]  polyethylene glycol (MIRALAX / GLYCOLAX) packet Take 17 g by mouth daily as needed.   Yes [provider]   No Known Allergies Review of Systems  Unable to perform ROS: Dementia    Physical Exam Vitals signs and nursing note reviewed.  Constitutional:      General: He is awake.     Appearance: He is normal weight.     Comments: Chronically ill appearing   Cardiovascular:     Rate and Rhythm: Normal rate.     Pulses: Normal pulses.     Heart sounds: Normal heart sounds.  Pulmonary:     Effort: Pulmonary effort is normal.     Breath sounds: Decreased breath  sounds present.  Skin:    General: Skin is warm and dry.     Findings: Bruising present.     Comments: Right eye bruising s/p fall   Neurological:     Mental Status:  He is alert.     Comments: Alert to self only   Psychiatric:        Mood and Affect: Mood normal.        Speech: Speech normal.        Behavior: Behavior is cooperative.        Cognition and Memory: Cognition is impaired. Memory is impaired.        Judgment: Judgment is inappropriate.     Vital Signs: BP (!) 160/80 (BP Location: Right Arm)    Pulse 70    Temp 98.9 F (37.2 C) (Oral)    Resp 20    Ht 5\' 11"  (1.803 m)    Wt 95.7 kg    SpO2 97%    BMI 29.43 kg/m  Pain Scale: PAINAD POSS *See Group Information*: S-Acceptable,Sleep, easy to arouse Pain Score: Asleep   SpO2: SpO2: 97 % O2 Device:SpO2: 97 % O2 Flow Rate: .   IO: Intake/output summary:   Intake/Output Summary (Last 24 hours) at 08/22/2018 1154 Last data filed at 08/22/2018 0900 Gross per 24 hour  Intake 1341.87 ml  Output 500 ml  Net 841.87 ml    LBM: Last BM Date: (unknown) Baseline Weight: Weight: 95.7 kg Most recent weight: Weight: 95.7 kg     Palliative Assessment/Data: PPS 30%   Time In: 1100 Time Out: 1215 Time Total: 32min  Greater than 50%  of this time was spent counseling and coordinating care related to the above assessment and plan.  Signed by:  Alda Lea, AGPCNP-BC Palliative Medicine Team  Phone: [phone] Fax: [phone] Pager: [phone] Amion: Bjorn Pippin    Please contact Palliative Medicine Team phone at [phone] for questions and concerns.  For individual provider: See Shea Evans

## 2018-08-22 NOTE — Progress Notes (Signed)
[place] at [place] NAME: William Ryan    MR#:  [id number]  DATE OF BIRTH:  Jan 07, 1922  SUBJECTIVE:  CHIEF COMPLAINT:   Chief Complaint  Patient presents with  . Fall   Confused.   Pleasant Afebrile  REVIEW OF SYSTEMS:    Review of Systems  Unable to perform ROS: Mental status change    DRUG ALLERGIES:  No Known Allergies  VITALS:  Blood pressure (!) 160/80, pulse 70, temperature 98.9 F (37.2 C), temperature source Oral, resp. rate 20, height 5\' 11"  (1.803 m), weight 95.7 kg, SpO2 97 %.  PHYSICAL EXAMINATION:   Physical Exam  GENERAL:  85 y.o.-year-old patient lying in the bed with no acute distress.  EYES: Pupils equal, round, reactive to light and accommodation. No scleral icterus. Extraocular muscles intact.  HEENT: Head atraumatic, normocephalic. Oropharynx and nasopharynx clear.  NECK:  Supple, no jugular venous distention. No thyroid enlargement, no tenderness.  LUNGS: Normal breath sounds bilaterally, no wheezing, rales, rhonchi. No use of accessory muscles of respiration.  CARDIOVASCULAR: S1, S2 normal. No murmurs, rubs, or gallops.  ABDOMEN: Soft, nontender, nondistended. Bowel sounds present. No organomegaly or mass.  EXTREMITIES: No cyanosis, clubbing or edema b/l.    NEUROLOGIC: Moves all 4 extremities.  Motor strength 4/5 in upper and lower extremities. PSYCHIATRIC: The patient is alert and awake.  Pleasantly confused SKIN: No obvious rash, lesion, or ulcer.   LABORATORY PANEL:   CBC Recent Labs  Lab 08/19/18 1324  WBC 9.1  HGB 10.2*  HCT 32.4*  PLT 179   ------------------------------------------------------------------------------------------------------------------ Chemistries  Recent Labs  Lab 08/19/18 1324  NA 135  K 3.9  CL 103  CO2 25  GLUCOSE 119*  BUN 21  CREATININE 1.24  CALCIUM 8.8*  AST 27  ALT 12  ALKPHOS 67  BILITOT 0.7    ------------------------------------------------------------------------------------------------------------------  Cardiac Enzymes Recent Labs  Lab 08/19/18 1324  TROPONINI <0.03   ------------------------------------------------------------------------------------------------------------------  RADIOLOGY:  Ct Head Wo Contrast  Result Date: 08/20/2018 CLINICAL DATA:  Follow-up scan from yesterday. Subdural hemorrhage. EXAM: CT HEAD WITHOUT CONTRAST TECHNIQUE: Contiguous axial images were obtained from the base of the skull through the vertex without intravenous contrast. COMPARISON:  Head CT dated 08/19/2018. FINDINGS: Brain: Again noted is the large mixed density subdural hemorrhage overlying the LEFT frontal convexity, again measuring 24 mm in thickness (best measured on coronal reconstruction images). This subdural collection contains a mixture of intermediate and high density blood products, stable in amount, with associated mass effect and rightward midline shift of 6 mm which is also stable. There is a stable small high-density subdural hematoma overlying the LEFT frontal lobe. No new parenchymal or extra-axial hemorrhage. There is now a small amount of intraventricular hemorrhage seen, layering within the posterior horns of each lateral ventricle. Again noted is generalized parenchymal volume loss with commensurate dilatation of the ventricles and sulci. Ventricles are stable in size and configuration. Chronic small vessel ischemic changes again noted within the bilateral periventricular and subcortical white matter regions. Vascular: No hyperdense vessel or unexpected calcification. Skull: Normal. Negative for fracture or focal lesion. Sinuses/Orbits: No acute finding. Other: None. IMPRESSION: 1. No significant interval change compared to yesterday's head CT. Large mixed density subdural hemorrhage overlying the LEFT frontal convexity, again measuring 24 mm in thickness, stable in amount,  with associated mass effect and rightward midline shift of 6 mm. 2. Stable small high-density subdural hematoma overlying the LEFT frontal lobe. 3. New small amount  of intraventricular hemorrhage layering within the posterior horns of each lateral ventricle. No hydrocephalus. 4. No new parenchymal or extra-axial hemorrhage. Electronically Signed   By: William Ryan M.D.   On: 08/20/2018 14:12     ASSESSMENT AND PLAN:   Patient is 85 year old presenting after a fall  1.  Subdural hematoma DNR/DNI.  Family requested no aggressive care or surgeries. Repeat CT scan showed progression into intraventricular space. Patient continues to be confused with dementia. Poor prognosis.  2.  Accelerated hypertension. Hydralazine.  3.  History atrial fibrillation Discontinued Eliquis would not use this medication in this patient with recurrent falls on discharge.  4.  GERD continue Protonix  5.  Miscellaneous SCDs for DVT prophylaxis  All the records are reviewed and case discussed with Care Management/Social Worker Management plans discussed with the patient, family and they are in agreement.  CODE STATUS: DNR  DVT Prophylaxis: SCDs  TOTAL TIME TAKING CARE OF THIS PATIENT: 30 minutes.   POSSIBLE D/C IN 1-2 DAYS, DEPENDING ON CLINICAL CONDITION.  William Ryan M.D on 08/22/2018 at 7:45 AM  Between 7am to 6pm - Pager - [phone]  After 6pm go to [email or web address] - password [email or web address] [facility]  Office  [phone]  CC: Primary care physician; William Crouch, MD  Note: This dictation was prepared with Dragon dictation along with smaller phrase technology. Any transcriptional errors that result from this process are unintentional.

## 2018-08-22 NOTE — TOC Progression Note (Signed)
Transition of Care [facility]) - Progression Note    Patient Details  Name: William Ryan MRN: [id number] Date of Birth: 11-01-1921  Transition of Care [facility]) CM/SW Contact  Katrina Stack, RN Phone Number: 08/22/2018, 1:53 PM  Clinical Narrative:    Palliative consult is pending for patient that with subdural hematoma.  Family is not seeking no aggressive are.  Patient also with dementia and very confused ad requiring sitter.  New order for Seroquel.         Expected Discharge Plan and Services           Expected Discharge Date: 08/21/18                         Social Determinants of Health (SDOH) Interventions    Readmission Risk Interventions No flowsheet data found.

## 2018-08-22 NOTE — Progress Notes (Signed)
[place] at [place] NAME: William Ryan    MR#:  [id number]  DATE OF BIRTH:  July 10, 1921  SUBJECTIVE:  CHIEF COMPLAINT:   Chief Complaint  Patient presents with  . Fall   Confused.   Pleasant Afebrile Sitter at bedside  REVIEW OF SYSTEMS:    Review of Systems  Unable to perform ROS: Mental status change    DRUG ALLERGIES:  No Known Allergies  VITALS:  Blood pressure (!) 160/80, pulse 70, temperature 98.9 F (37.2 C), temperature source Oral, resp. rate 20, height 5\' 11"  (1.803 m), weight 95.7 kg, SpO2 97 %.  PHYSICAL EXAMINATION:   Physical Exam  GENERAL:  85 y.o.-year-old patient lying in the bed with no acute distress.  EYES: Pupils equal, round, reactive to light and accommodation. No scleral icterus. Extraocular muscles intact.  HEENT: Head atraumatic, normocephalic. Oropharynx and nasopharynx clear.  NECK:  Supple, no jugular venous distention. No thyroid enlargement, no tenderness.  LUNGS: Normal breath sounds bilaterally, no wheezing, rales, rhonchi. No use of accessory muscles of respiration.  CARDIOVASCULAR: S1, S2 normal. No murmurs, rubs, or gallops.  ABDOMEN: Soft, nontender, nondistended. Bowel sounds present. No organomegaly or mass.  EXTREMITIES: No cyanosis, clubbing or edema b/l.    NEUROLOGIC: Moves all 4 extremities.  Motor strength 4/5 in upper and lower extremities. PSYCHIATRIC: The patient is alert and awake.  Pleasantly confused SKIN: No obvious rash, lesion, or ulcer.   LABORATORY PANEL:   CBC Recent Labs  Lab 08/19/18 1324  WBC 9.1  HGB 10.2*  HCT 32.4*  PLT 179   ------------------------------------------------------------------------------------------------------------------ Chemistries  Recent Labs  Lab 08/19/18 1324  NA 135  K 3.9  CL 103  CO2 25  GLUCOSE 119*  BUN 21  CREATININE 1.24  CALCIUM 8.8*  AST 27  ALT 12  ALKPHOS 67  BILITOT 0.7    ------------------------------------------------------------------------------------------------------------------  Cardiac Enzymes Recent Labs  Lab 08/19/18 1324  TROPONINI <0.03   ------------------------------------------------------------------------------------------------------------------  RADIOLOGY:  Ct Head Wo Contrast  Result Date: 08/20/2018 CLINICAL DATA:  Follow-up scan from yesterday. Subdural hemorrhage. EXAM: CT HEAD WITHOUT CONTRAST TECHNIQUE: Contiguous axial images were obtained from the base of the skull through the vertex without intravenous contrast. COMPARISON:  Head CT dated 08/19/2018. FINDINGS: Brain: Again noted is the large mixed density subdural hemorrhage overlying the LEFT frontal convexity, again measuring 24 mm in thickness (best measured on coronal reconstruction images). This subdural collection contains a mixture of intermediate and high density blood products, stable in amount, with associated mass effect and rightward midline shift of 6 mm which is also stable. There is a stable small high-density subdural hematoma overlying the LEFT frontal lobe. No new parenchymal or extra-axial hemorrhage. There is now a small amount of intraventricular hemorrhage seen, layering within the posterior horns of each lateral ventricle. Again noted is generalized parenchymal volume loss with commensurate dilatation of the ventricles and sulci. Ventricles are stable in size and configuration. Chronic small vessel ischemic changes again noted within the bilateral periventricular and subcortical white matter regions. Vascular: No hyperdense vessel or unexpected calcification. Skull: Normal. Negative for fracture or focal lesion. Sinuses/Orbits: No acute finding. Other: None. IMPRESSION: 1. No significant interval change compared to yesterday's head CT. Large mixed density subdural hemorrhage overlying the LEFT frontal convexity, again measuring 24 mm in thickness, stable in amount,  with associated mass effect and rightward midline shift of 6 mm. 2. Stable small high-density subdural hematoma overlying the LEFT frontal lobe. 3.  New small amount of intraventricular hemorrhage layering within the posterior horns of each lateral ventricle. No hydrocephalus. 4. No new parenchymal or extra-axial hemorrhage. Electronically Signed   By: Franki Cabot M.D.   On: 08/20/2018 14:12     ASSESSMENT AND PLAN:   Patient is 85 year old presenting after a fall  1.  Subdural hematoma. Repeat CT scan showed progression into intraventricular space. DNR/DNI.  Family requested no aggressive care or surgeries. Patient continues to be confused with dementia. Poor prognosis.  Will add seroquel at night  2.  Accelerated hypertension. Hydralazine.  3.  History atrial fibrillation Discontinued Eliquis would not use this medication in this patient with recurrent falls on discharge.  4.  GERD continue Protonix  5.  Miscellaneous SCDs for DVT prophylaxis  We will have to make sure patient is accepted at his assisted living facility.  Discharged back with palliative care following.  All the records are reviewed and case discussed with Care Management/Social Worker Management plans discussed with the patient, family and they are in agreement.  CODE STATUS: DNR  DVT Prophylaxis: SCDs  TOTAL TIME TAKING CARE OF THIS PATIENT: 30 minutes.   POSSIBLE D/C IN 1-2 DAYS, DEPENDING ON CLINICAL CONDITION.  Leia Alf Kerina Simoneau M.D on 08/22/2018 at 1:46 PM  Between 7am to 6pm - Pager - [phone]  After 6pm go to [email or web address] - password [email or web address] [facility]  Office  [phone]  CC: Primary care physician; Idelle Crouch, MD  Note: This dictation was prepared with Dragon dictation along with smaller phrase technology. Any transcriptional errors that result from this process are unintentional.

## 2018-08-22 NOTE — Progress Notes (Signed)
Discussed with patient's healthcare power of attorney, [name] [phone]

## 2018-08-23 DIAGNOSIS — Z7189 Other specified counseling: Secondary | ICD-10-CM

## 2018-08-23 MED ORDER — TRAMADOL HCL 50 MG PO TABS
50.0000 mg | ORAL_TABLET | Freq: Four times a day (QID) | ORAL | Status: DC | PRN
  Administered 2018-08-23 – 2018-08-24 (×3): 50 mg via ORAL
  Filled 2018-08-23 (×3): qty 1

## 2018-08-23 NOTE — Progress Notes (Signed)
[place] at [place] NAME: William Ryan    MR#:  [id number]  DATE OF BIRTH:  1922-05-17  SUBJECTIVE:  CHIEF COMPLAINT:   Chief Complaint  Patient presents with  . Fall   Confused.     REVIEW OF SYSTEMS:    Review of Systems  Unable to perform ROS: Mental status change    DRUG ALLERGIES:  No Known Allergies  VITALS:  Blood pressure (!) 121/57, pulse 72, temperature 98.8 F (37.1 C), temperature source Axillary, resp. rate 17, height 5\' 11"  (1.803 m), weight 95.7 kg, SpO2 97 %.  PHYSICAL EXAMINATION:   Physical Exam  GENERAL:  85 y.o.-year-old patient lying in the bed with no acute distress.  EYES: Pupils equal, round, reactive to light and accommodation. No scleral icterus. Extraocular muscles intact.  HEENT: Head atraumatic, normocephalic. Oropharynx and nasopharynx clear.  NECK:  Supple, no jugular venous distention. No thyroid enlargement, no tenderness.  LUNGS: Normal breath sounds bilaterally, no wheezing, rales, rhonchi. No use of accessory muscles of respiration.  CARDIOVASCULAR: S1, S2 normal. No murmurs, rubs, or gallops.  ABDOMEN: Soft, nontender, nondistended. Bowel sounds present. No organomegaly or mass.  EXTREMITIES: No cyanosis, clubbing or edema b/l.    NEUROLOGIC: Moves all 4 extremities.  Motor strength 4/5 in upper and lower extremities. PSYCHIATRIC: The patient is alert and awake.  Pleasantly confused SKIN: No obvious rash, lesion, or ulcer.   LABORATORY PANEL:   CBC Recent Labs  Lab 08/19/18 1324  WBC 9.1  HGB 10.2*  HCT 32.4*  PLT 179   ------------------------------------------------------------------------------------------------------------------ Chemistries  Recent Labs  Lab 08/19/18 1324  NA 135  K 3.9  CL 103  CO2 25  GLUCOSE 119*  BUN 21  CREATININE 1.24  CALCIUM 8.8*  AST 27  ALT 12  ALKPHOS 67  BILITOT 0.7    ------------------------------------------------------------------------------------------------------------------  Cardiac Enzymes Recent Labs  Lab 08/19/18 1324  TROPONINI <0.03   ------------------------------------------------------------------------------------------------------------------  RADIOLOGY:  No results found.   ASSESSMENT AND PLAN:   Patient is 85 year old presenting after a fall  1.  Subdural hematoma. Repeat CT scan showed progression into intraventricular space. DNR/DNI.  Family requested no aggressive care or surgeries. Patient continues to be confused with dementia. Poor prognosis. Plan is to discharge back to assisted living facility with hospice Seroquel added at night  2.  Accelerated hypertension. Hydralazine.  3.  History atrial fibrillation Discontinued Eliquis would not use this medication in this patient with recurrent falls on discharge.  4.  GERD continue Protonix  5.  Miscellaneous SCDs for DVT prophylaxis   All the records are reviewed and case discussed with Care Management/Social Worker Management plans discussed with the patient, family and they are in agreement.  CODE STATUS: DNR  DVT Prophylaxis: SCDs  TOTAL TIME TAKING CARE OF THIS PATIENT: 30 minutes.   Likely discharge tomorrow once arrangements are made  Neita Carp M.D on 08/23/2018 at 1:03 PM  Between 7am to 6pm - Pager - [phone]  After 6pm go to [email or web address] - password [email or web address] [facility]  Office  [phone]  CC: Primary care physician; Idelle Crouch, MD  Note: This dictation was prepared with Dragon dictation along with smaller phrase technology. Any transcriptional errors that result from this process are unintentional.

## 2018-08-23 NOTE — Progress Notes (Signed)
New referral for [facility] hospice at [facility] received from [facility] following a Palliative Medicine consult. No discharge date at this time. DME to be determined. Patient information faxed to referral. Will continue to follow through discharge. Flo Shanks BSN, RN, [place] [facility] [phone]

## 2018-08-23 NOTE — TOC Progression Note (Signed)
Transition of Care [facility]) - Progression Note    Patient Details  Name: William Ryan MRN: [id number] Date of Birth: 1921-08-13  Transition of Care [facility]) CM/SW [place] Phone Number: 08/23/2018, 2:11 PM  Clinical Narrative:   CSW spoke with patient's daughter [name] [phone]. Daughter reports that she would like patient to return to [place] with hospice services through [facility]. CSW spoke with Vaughan Basta at [facility] who states that they can accept patient back with hospice services but would like to know if he is able to walk. CSW notified MD of this and a PT consult was ordered. CSW also notified Santiago Glad with [facility] of referral. CSW will continue to follow for discharge planning.          Expected Discharge Plan and Services           Expected Discharge Date: 08/21/18                         Social Determinants of Health (SDOH) Interventions    Readmission Risk Interventions No flowsheet data found.

## 2018-08-23 NOTE — Progress Notes (Signed)
Palliative Note:  Patient awake and calm lying in bed. Sitter is present and expresses patient ate some oatmeal without complications this morning. He is pleasant and able to verbalize name, dob, his children's information. Unable to identify location and situation. Follows commands appropriately.   Multiple attempts have been made to reach out to daughter/POA. I have left several voice mails over the past 24 hours and will continue to await phone call and make additional attempts to reach.   Patient is comfortable and in no distress.   Assessment: Awake, alert to self only Sitter at bedside Lungs diminished in bases, positive pulses, no edema, S1S2, right eye bruising s/p fall  Plan -DNR per chart -Continue to treat -Outpatient palliative at minimum once discharged -PMT will continue to support, follow, and reach out to family  Total Time: 20 min  Greater than 50%  of this time was spent counseling and coordinating care related to the above assessment and plan  Alda Lea, AGPCNP-BC Palliative Medicine Team  Pager: [phone] Amion: Bjorn Pippin

## 2018-08-24 DIAGNOSIS — W19XXXA Unspecified fall, initial encounter: Secondary | ICD-10-CM

## 2018-08-24 MED ORDER — LORAZEPAM 0.5 MG PO TABS
0.5000 mg | ORAL_TABLET | Freq: Four times a day (QID) | ORAL | Status: DC | PRN
  Administered 2018-08-24: 0.5 mg via ORAL
  Filled 2018-08-24: qty 1

## 2018-08-24 NOTE — Evaluation (Signed)
Physical Therapy Evaluation Patient Details Name: William Ryan MRN: [id number] DOB: Apr 21, 1922 Today's Date: 08/24/2018   History of Present Illness  JamesMinceyis a12 y.o.malewith a known history of atrial fibrillation, anemia, GERD, hyperlipidemia, hypertension, history of pacemaker, prostate cancer, sick sinus syndrome who currently resides in [place]. Patient has a history of recurrent falls. He fell yesterday this morning he was confused he was brought to the emergency room CT scan shows subdural hematoma.  Clinical Impression  Patient requires mod x2 for supine<> sit and sit<> transfers with cuing for safety and attempt at initiation. Patient is able to remain sitting with feet supported and remain standing with UE supported with min guard. Patient is able to complete rolling CGA to assist PT in cleaning and changing. Patient with impairments in LE and core strength, mobility, endurance, balance, and pain. Patient is currently unable to complete ambulatory, dynamic balance, lifting, or standing activities; inhibiting full participation in his ADLs in his retirement community. Would benefit from skilled PT to address above deficits and promote optimal return to PLOF     Follow Up Recommendations SNF    Equipment Recommendations  Rolling walker with 5" wheels    Recommendations for Other Services       Precautions / Restrictions Precautions Precautions: Fall Restrictions Weight Bearing Restrictions: No      Mobility  Bed Mobility Overal bed mobility: Needs Assistance Bed Mobility: Supine to Sit;Sit to Supine;Rolling Rolling: Min guard   Supine to sit: +2 for safety/equipment;Mod assist Sit to supine: +2 for safety/equipment;Mod assist   General bed mobility comments: Patient mod assist for transfer to EOB reporting "I don't think I can do this, and tht he "hurts all over". Once EOB is able to remain sitting without support or UE support with feet supported. Able  to roll to sit to allow for PT to clean patient and redress following defecation in the bed  Transfers Overall transfer level: Needs assistance Equipment used: Rolling walker (2 wheeled) Transfers: Sit to/from Stand Sit to Stand: Mod assist;+2 safety/equipment         General transfer comment: Mod +2 for STS, min x2 to remain standing, min-mod for controlled lowering, reports he cannot take any steps following standing. Able to remain standing for 42min CGA  Ambulation/Gait                Stairs            Wheelchair Mobility    Modified Rankin (Stroke Patients Only)       Balance Overall balance assessment: Needs assistance   Sitting balance-Leahy Scale: Good       Standing balance-Leahy Scale: Zero                               Pertinent Vitals/Pain Pain Assessment: Faces Faces Pain Scale: Hurts whole lot Pain Location: Patient reports "pain all over" Pain Descriptors / Indicators: Aching;Sore Pain Intervention(s): Limited activity within patient's tolerance;Monitored during session    Home Living Family/patient expects to be discharged to:: Assisted living                 Additional Comments: Patient is pleasantly confused, unable to give history at this time of home or PLOF    Prior Function                 Hand Dominance   Dominant Hand: Right    Extremity/Trunk Assessment   Upper  Extremity Assessment Upper Extremity Assessment: Generalized weakness    Lower Extremity Assessment Lower Extremity Assessment: Generalized weakness    Cervical / Trunk Assessment Cervical / Trunk Assessment: Normal  Communication   Communication: No difficulties  Cognition Arousal/Alertness: Awake/alert Behavior During Therapy: WFL for tasks assessed/performed(Re-directed to stop pulling catheter, good compliance with re-direction) Overall Cognitive Status: History of cognitive impairments - at baseline                                  General Comments: Patient is able to give his first name, when told his last name says "I think I knew him once", when explained Ra is his last name says he remembers, "his name is William Ryan". Reports he thinks he is at home, no orientation to time/place/date      General Comments      Exercises Other Exercises Other Exercises: Patient required modA x2 for sit <>> supine, d/t pain he reports is "all over. PT cuing for technique and hand placement. Able to remain sitting and reach for PTs hand with feet supported x2 inside BOS, unable to reach outside BOS and maintain balance. Patient requires x2 modA for standing with cuing for safety/hand placement. patient is able to remain standing CGA, but cannot initiae any steps, despite cuing. Once back in bed patient is able to assist PT in rolling CGA for cleaning and redressing following defecation in bed, does moan in pain a little.    Assessment/Plan    PT Assessment Patient needs continued PT services  PT Problem List Pain;Decreased strength;Decreased coordination;Decreased range of motion;Decreased activity tolerance;Decreased balance;Decreased mobility;Decreased safety awareness       PT Treatment Interventions DME instruction;Therapeutic exercise;Wheelchair mobility training;Gait training;Balance training;Manual Primary school teacher;Functional mobility training;Therapeutic activities;Patient/family education    PT Goals (Current goals can be found in the Care Plan section)  Acute Rehab PT Goals Patient Stated Goal: Walk to the bathroom, decrease pain PT Goal Formulation: With patient Time For Goal Achievement: 09/07/18 Potential to Achieve Goals: Fair    Frequency Min 2X/week   Barriers to discharge   Patient unable to give reliable history    Co-evaluation               AM-PAC PT "6 Clicks" Mobility  Outcome Measure Help needed turning from your back  to your side while in a flat bed without using bedrails?: A Little Help needed moving from lying on your back to sitting on the side of a flat bed without using bedrails?: A Lot Help needed moving to and from a bed to a chair (including a wheelchair)?: A Lot Help needed standing up from a chair using your arms (e.g., wheelchair or bedside chair)?: A Lot Help needed to walk in hospital room?: Total Help needed climbing 3-5 steps with a railing? : Total 6 Click Score: 11    End of Session Equipment Utilized During Treatment: Gait belt Activity Tolerance: Patient limited by pain Patient left: in bed;with bed alarm set;with call bell/phone within reach Nurse Communication: Mobility status PT Visit Diagnosis: Unsteadiness on feet (R26.81);Other abnormalities of gait and mobility (R26.89);Difficulty in walking, not elsewhere classified (R26.2);Repeated falls (R29.6);Muscle weakness (generalized) (M62.81);History of falling (Z91.81);Pain Pain - Right/Left: (reports pain all over)    Time: 0129-0154 PT Time Calculation (min) (ACUTE ONLY): 25 min   Charges:     PT Treatments $Therapeutic Activity: 8-22 mins  Shelton Silvas PT, DPT  Shelton Silvas 08/24/2018, 2:26 PM

## 2018-08-24 NOTE — Care Management Important Message (Signed)
Important Message  Patient Details  Name: William Ryan MRN: [id number] Date of Birth: Nov 23, 1921   Medicare Important Message Given:  Yes    William Ryan 08/24/2018, 11:27 AM

## 2018-08-24 NOTE — Progress Notes (Signed)
PALLIATIVE NOTE:  Mr. Donson lying in bed. He is awake and calm. Alert to self only. Mitts in place to protect IV line and foley. Safety sitter at bedside. Patient is pleasant in conversations.  Discussing that he works for [employer] as an Art gallery manager and was wondering how he can get business for a company.  He denied pain or shortness of breath.  No obvious signs of distress. Does follow some commands when prompted.   He continues to have poor appetite and is eating less than 25% for at least one meal/day otherwise only small amounts of bites and sips.   Updates provided to his Daughter, who express she and her brother's goal is to get him back to [facility] with assistance and hospice support. They are aware that pending further assessment to evaluate needs for his return to [place] are in process. Daughter verbalized understanding and appreciation.   Assessment: Awake, alert to self only, follows commands, pleasant, hx of dementia S1S2, diminished bases, positive bowel sounds  Plan: -DNR/DNI -Continue to treat, no escalation of care/no aggressive interventions -Children hopeful he can return to [facility] facility with assistance and community hospice -Pending PT evaluation for ambulatory needs per CSW.  -Hospice at facility once discharged -PMT will continue to support and follow as needed. I will be off service tomorrow. Please page Crystal G via Amion if further needs arise on Friday 3/27.   Total Time: 35 min  Greater than 50%  of this time was spent counseling and coordinating care related to the above assessment and plan  Alda Lea, AGPCNP-BC Palliative Medicine Team  Pager: [phone] Amion: N. [place]

## 2018-08-24 NOTE — Progress Notes (Signed)
[facility] [place] NAME: William Ryan    MR#:  [id number]  DATE OF BIRTH:  1921/08/03  SUBJECTIVE:  CHIEF COMPLAINT:   Chief Complaint  Patient presents with  . Fall   Continues to be confused Mitts on hands   REVIEW OF SYSTEMS:    Review of Systems  Unable to perform ROS: Mental status change    DRUG ALLERGIES:  No Known Allergies  VITALS:  Blood pressure (!) 145/62, pulse 71, temperature 98.6 F (37 C), temperature source Oral, resp. rate 17, height 5\' 11"  (1.803 m), weight 95.7 kg, SpO2 98 %.  PHYSICAL EXAMINATION:   Physical Exam  GENERAL:  85 y.o.-year-old patient lying in the bed with no acute distress.  EYES: Pupils equal, round, reactive to light and accommodation. No scleral icterus. Extraocular muscles intact.  HEENT: Head atraumatic, normocephalic. Oropharynx and nasopharynx clear.  NECK:  Supple, no jugular venous distention. No thyroid enlargement, no tenderness.  LUNGS: Normal breath sounds bilaterally, no wheezing, rales, rhonchi. No use of accessory muscles of respiration.  CARDIOVASCULAR: S1, S2 normal. No murmurs, rubs, or gallops.  ABDOMEN: Soft, nontender, nondistended. Bowel sounds present. No organomegaly or mass.  EXTREMITIES: No cyanosis, clubbing or edema b/l.    NEUROLOGIC: Moves all 4 extremities.  Motor strength 4/5 in upper and lower extremities. PSYCHIATRIC: The patient is alert and awake.  Pleasantly confused SKIN: No obvious rash, lesion, or ulcer.   LABORATORY PANEL:   CBC Recent Labs  Lab 08/19/18 1324  WBC 9.1  HGB 10.2*  HCT 32.4*  PLT 179   ------------------------------------------------------------------------------------------------------------------ Chemistries  Recent Labs  Lab 08/19/18 1324  NA 135  K 3.9  CL 103  CO2 25  GLUCOSE 119*  BUN 21  CREATININE 1.24  CALCIUM 8.8*  AST 27  ALT 12  ALKPHOS 67  BILITOT 0.7    ------------------------------------------------------------------------------------------------------------------  Cardiac Enzymes Recent Labs  Lab 08/19/18 1324  TROPONINI <0.03   ------------------------------------------------------------------------------------------------------------------  RADIOLOGY:  No results found.   ASSESSMENT AND PLAN:   Patient is 85 year old presenting after a fall  1.  Subdural hematoma. Repeat CT scan showed progression into intraventricular space. DNR/DNI.  Family requested no aggressive care or surgeries. Patient continues to be confused with dementia. Poor prognosis. Plan is to discharge back to assisted living facility with hospice  Douglass Rivers to decide patient's return after PT evaluation. Seroquel added at night  2.  Accelerated hypertension. Hydralazine.  3.  History atrial fibrillation Discontinued Eliquis would not use this medication in this patient with recurrent falls on discharge.  4.  GERD continue Protonix  5.  Miscellaneous SCDs for DVT prophylaxis  6.  Urinary retention In and out catheterization overnight to 1200 mL urine output. Will repeat bladder scan if no urine output. Foley catheter if further problems.   All the records are reviewed and case discussed with Care Management/Social Worker Management plans discussed with the patient, family and they are in agreement.  CODE STATUS: DNR  DVT Prophylaxis: SCDs  TOTAL TIME TAKING CARE OF THIS PATIENT: 30 minutes.    Leia Alf Nadeem Romanoski M.D on 08/24/2018 at 2:10 PM  Between 7am to 6pm - Pager - [phone]  After 6pm go to [email or web address] - password [email or web address] [facility]  Office  [phone]  CC: Primary care physician; Idelle Crouch, MD  Note: This dictation was prepared with Dragon dictation along with smaller phrase technology. Any transcriptional errors that result from this process  are unintentional.

## 2018-08-25 MED ORDER — QUETIAPINE FUMARATE 50 MG PO TABS: 50.0000 mg | ORAL_TABLET | Freq: Every day | ORAL | 0 refills | Status: AC

## 2018-08-25 MED ORDER — LORAZEPAM 0.5 MG PO TABS: 0.5000 mg | ORAL_TABLET | Freq: Four times a day (QID) | ORAL | 0 refills | Status: AC | PRN

## 2018-08-25 MED ORDER — AMLODIPINE BESYLATE 10 MG PO TABS: 10.0000 mg | ORAL_TABLET | Freq: Every day | ORAL | 0 refills | Status: AC

## 2018-08-25 MED ORDER — TAMSULOSIN HCL 0.4 MG PO CAPS: 0.4000 mg | ORAL_CAPSULE | Freq: Every day | ORAL | 0 refills | Status: AC

## 2018-08-25 NOTE — Progress Notes (Signed)
Telephone call to patient's daughter [name] ([phone]) to initiate education regarding hospice services, philosophy and team approach to care with understanding voiced. Hospice contact information given. Discharge summary faxed to referral. Flo Shanks BSN, RN, [facility] liaison [facility] [phone]

## 2018-08-25 NOTE — Progress Notes (Signed)
PT Cancellation Note  Patient Details Name: William Ryan MRN: [id number] DOB: [birth year]   Cancelled Treatment:    Reason Eval/Treat Not Completed: Patient's level of consciousness(Asleep unable to rouse) Unable to wake patient with verbal and tactile cueing at this time. Will attempt again at a later time.   Janna Arch, PT, DPT   08/25/2018, 11:01 AM

## 2018-08-25 NOTE — Progress Notes (Signed)
PT Cancellation Note  Patient Details Name: BRANSTON HALSTED MRN: [id number] DOB: 1921/09/21   Cancelled Treatment:    Reason Eval/Treat Not Completed: Patient declined, no reason specified(refused to sit EOB, refused supine interventions). Spent 20 minutes attempting to convince patient with no success. Kept repeating "nononono" , able to perform 5 heel slides and then refuses more.   Janna Arch, PT, DPT   08/25/2018, 2:45 PM

## 2018-08-25 NOTE — Progress Notes (Signed)
Palliative Medicine RN Note: Rec'd call from pt's daughter Rosemarie Beath; she reports pt is due to d/c but she hasn't heard from SW. I spoke w SW Ed[place]; she will call Rosemarie Beath.  Marjie Skiff Haniel Fix, RN, BSN, [facility] Palliative Medicine Team 08/25/2018 11:55 AM Office [phone]

## 2018-08-25 NOTE — Progress Notes (Signed)
New referral for [facility] services at [facility] received from [place]. Patient is a 85 year old man with a PMH that includes anemia, atrial fibrillation, GERD, hyperlipidemia, hypertension, pacemaker, prostate cancer, and sick sinus syndrome. He was admitted to [facility] on 3/21 from [facility] with complaints of increased confusion following a fall on 3/20. Head CT in the ED revealed a subdural hematoma, repeat CT on 3/22 showed progression into intervesicular space. Family did not want any aggressive medical interventions and Palliative Medicine was consulted. Patient has continued with confusion and altered mental status. He was ambulatory prior to admission, but is currently unable to ambulate safely per chart note review. Per CSW patient will need a hospital bed with delivery requested for today. Patient seen sitting up in bed, somewhat agitated, calmed by interaction and voice. Foley catheter placed yesterday for urinary retention. Patient is currently taking his oral medications and ate well at breakfast today. Updated notes faxed to referral. Hospital care team updated.  Flo Shanks BSN, RN, [facility]  [phone]

## 2018-08-25 NOTE — TOC Transition Note (Signed)
Transition of Care [facility]) - CM/SW Discharge Note   Patient Details  Name: William Ryan MRN: [id number] Date of Birth: 05-25-1922  Transition of Care [facility]) CM/SW Contact:  Annamaria Boots, [place] Phone Number: 08/25/2018, 3:22 PM   Clinical Narrative:   Patient is medically ready for discharge today. CSW notified patient's daughter [name] [phone]. CSW also notified Vaughan Basta at [facility] of discharge today. Patient will be transported by EMS. RN to call report and call for transport.     Final next level of care: Assisted Living Barriers to Discharge: No Barriers Identified   Patient Goals and CMS Choice Patient states their goals for this hospitalization and ongoing recovery are:: Return to [facility].[email or web address] Compare Post Acute Care list provided to:: Patient Represenative (must comment)(Daughter- [name] ) Choice offered to / list presented to : Adult Children  Discharge Placement              Patient chooses bed at: [facility] ) Patient to be transferred to facility by: EMS Name of family member notified: [name]- Daughter  Patient and family notified of of transfer: 08/25/18  Discharge Plan and Services                          Social Determinants of Health (SDOH) Interventions     Readmission Risk Interventions No flowsheet data found.

## 2018-08-25 NOTE — Progress Notes (Signed)
Per MD patient is to be discharged with foley in due to urinary retention

## 2018-08-25 NOTE — Discharge Summary (Signed)
[facility] NAME: William Ryan    MR#:  [id number]  DATE OF BIRTH:  11/10/1921  DATE OF ADMISSION:  08/19/2018 ADMITTING PHYSICIAN: Dustin Flock, MD  DATE OF DISCHARGE: 08/25/2018  PRIMARY CARE PHYSICIAN: Idelle Crouch, MD   ADMISSION DIAGNOSIS:  Subdural hematoma ([place]) [M19.6Q2W] Fall [W19.XXXA]  DISCHARGE DIAGNOSIS:  Active Problems:   Subdural hematoma without coma ([place])   SECONDARY DIAGNOSIS:   Past Medical History:  Diagnosis Date  . A-fib ([place])   . Anemia   . GERD (gastroesophageal reflux disease)   . History of kidney stones   . HLD (hyperlipidemia)   . HTN (hypertension)   . Presence of permanent cardiac pacemaker   . Prostate cancer ([place])   . Sick sinus syndrome ([place])   . Urinary incontinence      ADMITTING HISTORY  HISTORY OF PRESENT ILLNESS: William Ryan  is a 85 y.o. male with a known history of atrial fibrillation, anemia, GERD, hyperlipidemia, hypertension, history of pacemaker, prostate cancer, sick sinus syndrome who currently resides in [place].  Patient has a history of recurrent falls.  He fell yesterday this morning he was confused he was brought to the emergency room CT scan shows subdural hematoma.  ER physician has discussed with patient's son and healthcare power of attorney regarding current presentation.  They do not want him to get any neurosurgical intervention.  The ED physician spoke to the on-call neurologist who recommends monitoring patient in the hospital.  Repeating a CT scan.  HOSPITAL COURSE:   Patient is 85 year old presenting after a fall  1.Subdural hematoma. Repeat CT scan showed progression into intraventricular space. DNR/DNI.  Family requested no aggressive care or surgeries. Patient continues to be confused with dementia. Poor prognosis. Plan is to discharge back to assisted living facility with hospice  services.  Patient has poor prognosis.  Hospice  following.  No escalation of care as per palliative care discussion with family.  Ativan ordered as needed.  2.Accelerated hypertension. Hydralazine IV PRN used in the hospital Norvasc 10 mg added.  Prescription printed.  3.History atrial fibrillation Discontinued Eliquis would not use this medication in this patient with recurrent falls on discharge.  4.GERD continue Protonix  5.Miscellaneous SCDs for DVT prophylaxis in the hospital  6.  Urinary retention In and out catheterization with  1200 mL urine output. Repeat bladder scan next day with 600 mL.  Foley catheter placed. Oxybutynin stopped.  Started on Flomax. We will continue Foley catheter at discharge.  Patient discharged to assisted living facility with hospice following.  Poor prognosis.  CONSULTS OBTAINED:    DRUG ALLERGIES:  No Known Allergies  DISCHARGE MEDICATIONS:   Allergies as of 08/25/2018   No Known Allergies     Medication List    STOP taking these medications   Eliquis 2.5 MG Tabs tablet Generic drug:  apixaban   oxybutynin 5 MG tablet Commonly known as:  DITROPAN     TAKE these medications   acetaminophen 650 MG CR tablet Commonly known as:  TYLENOL Take 1,300 mg by mouth every 8 (eight) hours as needed for pain.   amLODipine 10 MG tablet Commonly known as:  NORVASC Take 1 tablet (10 mg total) by mouth daily. Start taking on:  August 26, 2018   aspirin 81 MG tablet Take 81 mg by mouth 2 (two) times daily.   LORazepam 0.5 MG tablet Commonly known as:  ATIVAN Take 1 tablet (0.5 mg total)  by mouth every 6 (six) hours as needed for anxiety or sleep.   pantoprazole 40 MG tablet Commonly known as:  PROTONIX Take 40 mg by mouth daily.   polyethylene glycol packet Commonly known as:  MIRALAX / GLYCOLAX Take 17 g by mouth daily as needed.   QUEtiapine 50 MG tablet Commonly known as:  SEROQUEL Take 1 tablet (50 mg total) by mouth at bedtime.   tamsulosin 0.4 MG Caps  capsule Commonly known as:  FLOMAX Take 1 capsule (0.4 mg total) by mouth daily. Start taking on:  August 26, 2018       Today   VITAL SIGNS:  Blood pressure 136/63, pulse 70, temperature 98 F (36.7 C), temperature source Axillary, resp. rate 18, height 5\' 11"  (1.803 m), weight 95.7 kg, SpO2 97 %.  I/O:    Intake/Output Summary (Last 24 hours) at 08/25/2018 1129 Last data filed at 08/25/2018 0509 Gross per 24 hour  Intake 0 ml  Output 550 ml  Net -550 ml    PHYSICAL EXAMINATION:  Physical Exam  GENERAL:  85 y.o.-year-old patient lying in the bed  LUNGS: Normal breath sounds bilaterally, no wheezing, rales,rhonchi or crepitation. No use of accessory muscles of respiration.  CARDIOVASCULAR: S1, S2 normal. No murmurs, rubs, or gallops.  ABDOMEN: Soft, non-tender, non-distended. Bowel sounds present. No organomegaly or mass.  Foley catheter in place NEUROLOGIC: Moves all 4 extremities. PSYCHIATRIC: The patient is confused  DATA REVIEW:   CBC Recent Labs  Lab 08/19/18 1324  WBC 9.1  HGB 10.2*  HCT 32.4*  PLT 179    Chemistries  Recent Labs  Lab 08/19/18 1324  NA 135  K 3.9  CL 103  CO2 25  GLUCOSE 119*  BUN 21  CREATININE 1.24  CALCIUM 8.8*  AST 27  ALT 12  ALKPHOS 12  BILITOT 0.7    Cardiac Enzymes Recent Labs  Lab 08/19/18 1324  TROPONINI <0.03    Microbiology Results  Results for orders placed or performed during the hospital encounter of 08/19/18  MRSA PCR Screening     Status: None   Collection Time: 08/19/18  5:44 PM  Result Value Ref Range Status   MRSA by PCR NEGATIVE NEGATIVE Final    Comment:        The GeneXpert MRSA Assay (FDA approved for NASAL specimens only), is one component of a comprehensive MRSA colonization surveillance program. It is not intended to diagnose MRSA infection nor to guide or monitor treatment for MRSA infections. Performed at [facility], [address] [place], [place] [address]      RADIOLOGY:  No results found.  Follow up with PCP in 1 week.  Management plans discussed with the patient, family and they are in agreement.  CODE STATUS:     Code Status Orders  (From admission, onward)         Start     Ordered   08/19/18 1641  Do not attempt resuscitation (DNR)  Continuous    Question Answer Comment  In the event of cardiac or respiratory ARREST Do not call a "code blue"   In the event of cardiac or respiratory ARREST Do not perform Intubation, CPR, defibrillation or ACLS   In the event of cardiac or respiratory ARREST Use medication by any route, position, wound care, and other measures to relive pain and suffering. May use oxygen, suction and manual treatment of airway obstruction as needed for comfort.      08/19/18 1640  Code Status History    Date Active Date Inactive Code Status Order ID Comments User Context   02/15/2018 1409 02/15/2018 1808 Full Code [id number]  Isaias Cowman, MD Inpatient    Advance Directive Documentation     Most Recent Value  Type of Advance Directive  Healthcare Power of Attorney, Living will  Pre-existing out of facility DNR order (yellow form or pink MOST form)  -  "MOST" Form in Place?  -      TOTAL TIME TAKING CARE OF THIS PATIENT ON DAY OF DISCHARGE: more than 30 minutes.   Leia Alf Safir Michalec M.D on 08/25/2018 at 11:29 AM  Between 7am to 6pm - Pager - [phone]  After 6pm go to [email or web address] - password [email or web address] [facility]  Office  [phone]  CC: Primary care physician; Idelle Crouch, MD  Note: This dictation was prepared with Dragon dictation along with smaller phrase technology. Any transcriptional errors that result from this process are unintentional.

## 2018-08-25 NOTE — Progress Notes (Signed)
Called report to [place] med tech at [phone]

## 2018-08-26 ENCOUNTER — Other Ambulatory Visit: Payer: Self-pay

## 2018-08-26 ENCOUNTER — Encounter: Payer: Self-pay | Admitting: Emergency Medicine

## 2018-08-26 ENCOUNTER — Emergency Department
Admission: EM | Admit: 2018-08-26 | Discharge: 2018-08-26 | Disposition: A | Discharge status: Skilled Nursing Facility | Payer: Medicare Other | Attending: Emergency Medicine | Admitting: Emergency Medicine

## 2018-08-26 ENCOUNTER — Emergency Department: Payer: Medicare Other

## 2018-08-26 DIAGNOSIS — I10 Essential (primary) hypertension: Secondary | ICD-10-CM | POA: Insufficient documentation

## 2018-08-26 DIAGNOSIS — Z79899 Other long term (current) drug therapy: Secondary | ICD-10-CM | POA: Diagnosis not present

## 2018-08-26 DIAGNOSIS — Z7982 Long term (current) use of aspirin: Secondary | ICD-10-CM | POA: Insufficient documentation

## 2018-08-26 DIAGNOSIS — G20 Parkinson's disease: Secondary | ICD-10-CM | POA: Insufficient documentation

## 2018-08-26 DIAGNOSIS — F0390 Unspecified dementia without behavioral disturbance: Secondary | ICD-10-CM | POA: Insufficient documentation

## 2018-08-26 DIAGNOSIS — Z87891 Personal history of nicotine dependence: Secondary | ICD-10-CM | POA: Insufficient documentation

## 2018-08-26 DIAGNOSIS — Z950 Presence of cardiac pacemaker: Secondary | ICD-10-CM | POA: Insufficient documentation

## 2018-08-26 DIAGNOSIS — W19XXXA Unspecified fall, initial encounter: Secondary | ICD-10-CM

## 2018-08-26 DIAGNOSIS — Z8546 Personal history of malignant neoplasm of prostate: Secondary | ICD-10-CM | POA: Diagnosis not present

## 2018-08-26 LAB — URINALYSIS, COMPLETE (UACMP) WITH MICROSCOPIC
BILIRUBIN URINE: NEGATIVE
Glucose, UA: NEGATIVE mg/dL
Ketones, ur: 5 mg/dL — AB
Nitrite: NEGATIVE
Protein, ur: 30 mg/dL — AB
RBC / HPF: >50 RBC/hpf — ABNORMAL HIGH (ref 0–5)
SPECIFIC GRAVITY, URINE: 1.011 (ref 1.005–1.030)
pH: 6 (ref 5.0–8.0)

## 2018-08-26 LAB — CBC WITH DIFFERENTIAL/PLATELET
ABS IMMATURE GRANULOCYTES: 0.08 10*3/uL — AB (ref 0.00–0.07)
Basophils Absolute: 0 10*3/uL (ref 0.0–0.1)
Basophils Relative: 0 %
Eosinophils Absolute: 0.1 10*3/uL (ref 0.0–0.5)
Eosinophils Relative: 1 %
HCT: 38.1 % — ABNORMAL LOW (ref 39.0–52.0)
Hemoglobin: 12.2 g/dL — ABNORMAL LOW (ref 13.0–17.0)
Immature Granulocytes: 1 %
Lymphocytes Relative: 28 %
Lymphs Abs: 2.5 10*3/uL (ref 0.7–4.0)
MCH: 30 pg (ref 26.0–34.0)
MCHC: 32 g/dL (ref 30.0–36.0)
MCV: 93.8 fL (ref 80.0–100.0)
MONO ABS: 0.9 10*3/uL (ref 0.1–1.0)
Monocytes Relative: 10 %
NEUTROS ABS: 5.4 10*3/uL (ref 1.7–7.7)
Neutrophils Relative %: 60 %
Platelets: 230 10*3/uL (ref 150–400)
RBC: 4.06 MIL/uL — AB (ref 4.22–5.81)
RDW: 14.7 % (ref 11.5–15.5)
WBC: 8.9 10*3/uL (ref 4.0–10.5)
nRBC: 0 % (ref 0.0–0.2)

## 2018-08-26 LAB — BASIC METABOLIC PANEL
ANION GAP: 11 (ref 5–15)
BUN: 14 mg/dL (ref 8–23)
CO2: 23 mmol/L (ref 22–32)
Calcium: 9.2 mg/dL (ref 8.9–10.3)
Chloride: 101 mmol/L (ref 98–111)
Creatinine, Ser: 0.77 mg/dL (ref 0.61–1.24)
GFR calc Af Amer: >60 mL/min (ref >60)
GFR calc non Af Amer: >60 mL/min (ref >60)
Glucose, Bld: 130 mg/dL — ABNORMAL HIGH (ref 70–99)
Potassium: 3.8 mmol/L (ref 3.5–5.1)
Sodium: 135 mmol/L (ref 135–145)

## 2018-08-26 LAB — CK: Total CK: 116 U/L (ref 49–397)

## 2018-08-26 NOTE — ED Notes (Signed)
Per social work patient is to go back to facility in 3 hours when his wife is available to be at the facility to act as his sitter. Siadecki MD notified.

## 2018-08-26 NOTE — ED Provider Notes (Signed)
[facility] Emergency Department Provider Note ____________________________________________   First MD Initiated Contact with Patient 08/26/18 0920     (approximate)  I have reviewed the triage vital signs and the nursing notes.   HISTORY  Chief Complaint Fall  Level 5 caveat: History of present illness limited due to dementia  HPI William Ryan is a 85 y.o. male with PMH as noted below who presents after an unwitnessed fall.  The patient was found by facility staff on the floor this morning.  It is unclear exactly when he fell.  He is unable to give any specific history.  He denies any pain at this time.  Past Medical History:  Diagnosis Date  . A-fib ([place])   . Anemia   . GERD (gastroesophageal reflux disease)   . History of kidney stones   . HLD (hyperlipidemia)   . HTN (hypertension)   . Presence of permanent cardiac pacemaker   . Prostate cancer ([place])   . Sick sinus syndrome ([place])   . Urinary incontinence     Patient Active Problem List   Diagnosis Date Noted  . Subdural hematoma without coma ([place]) 08/19/2018  . Absence of bladder continence 04/28/2014  . Acute CVA (cerebrovascular accident) ([place]) 04/28/2014  . Essential hypertension 04/17/2014  . Parkinson's disease dementia ([place]) 04/17/2014  . Paroxysmal A-fib ([place]) 04/17/2014    Past Surgical History:  Procedure Laterality Date  . APPENDECTOMY    . EYE SURGERY     bilateral cataract  . INSERT / REPLACE / REMOVE PACEMAKER    . PACEMAKER INSERTION Left 02/15/2018   Procedure: CHANGE OUT SINGLE CHAMBER PACEMAKER;  Surgeon: Isaias Cowman, MD;  Location: [facility] ORS;  Service: Cardiovascular;  Laterality: Left;  . prostatectomy      Prior to Admission medications   Medication Sig Start Date End Date Taking? Authorizing Provider  amLODipine (NORVASC) 10 MG tablet Take 1 tablet (10 mg total) by mouth daily. 08/26/18  Yes Sudini, Alveta Heimlich, MD  oxybutynin (DITROPAN) 5 MG tablet Take 5  mg by mouth 2 (two) times daily. 07/20/18  Yes [provider]  pantoprazole (PROTONIX) 40 MG tablet Take 40 mg by mouth daily.    Yes [provider]  polyethylene glycol (MIRALAX / GLYCOLAX) packet Take 17 g by mouth daily as needed.   Yes [provider]  QUEtiapine (SEROQUEL) 50 MG tablet Take 1 tablet (50 mg total) by mouth at bedtime. 08/25/18  Yes Sudini, Alveta Heimlich, MD  tamsulosin (FLOMAX) 0.4 MG CAPS capsule Take 1 capsule (0.4 mg total) by mouth daily. 08/26/18  Yes Hillary Bow, MD  acetaminophen (TYLENOL) 650 MG CR tablet Take 1,300 mg by mouth every 8 (eight) hours as needed for pain.    [provider]  aspirin 81 MG tablet Take 81 mg by mouth 2 (two) times daily.     [provider]  LORazepam (ATIVAN) 0.5 MG tablet Take 1 tablet (0.5 mg total) by mouth every 6 (six) hours as needed for anxiety or sleep. 08/25/18   Hillary Bow, MD    Allergies Patient has no known allergies.  No family history on file.  Social History Social History   Tobacco Use  . Smoking status: Former Research scientist (life sciences)  . Smokeless tobacco: Never Used  Substance Use Topics  . Alcohol use: Not Currently    Alcohol/week: 0.0 standard drinks    Frequency: Never  . Drug use: Never    Review of Systems Level 5 caveat: Unable to obtain  review of systems due to dementia    ____________________________________________   PHYSICAL EXAM:  VITAL SIGNS: ED Triage Vitals [08/26/18 0911]  Enc Vitals Group     BP      Pulse Rate 73     Resp 17     Temp 98 F (36.7 C)     Temp Source Oral     SpO2      Weight      Height      Head Circumference      Peak Flow      Pain Score      Pain Loc      Pain Edu?      Excl. in [place]?     Constitutional: Alert, disoriented.  Weak appearing but in no acute distress.   Eyes: Conjunctivae are normal.  EOMI.  PERRLA. Head: Atraumatic. Nose: No congestion/rhinnorhea. Mouth/Throat: Mucous membranes are moist.   Neck: Normal  range of motion.  No midline cervical spinal tenderness. Cardiovascular: Normal rate, regular rhythm. Grossly normal heart sounds.  Good peripheral circulation. Respiratory: Normal respiratory effort.  No retractions. Lungs CTAB. Gastrointestinal: Soft and nontender. No distention.  Genitourinary: No flank tenderness. Musculoskeletal: No lower extremity edema.  Full range of motion in all extremities.  Extremities warm and well perfused.  No midline spinal tenderness. Neurologic:  Normal speech and language.  Motor intact in all extremities.  No gross focal neurologic deficits are appreciated.  Skin:  Skin is warm and dry. No rash noted. Psychiatric: Calm and cooperative.  ____________________________________________   LABS (all labs ordered are listed, but only abnormal results are displayed)  Labs Reviewed  BASIC METABOLIC PANEL - Abnormal; Notable for the following components:      Result Value   Glucose, Bld 130 (*)    All other components within normal limits  URINALYSIS, COMPLETE (UACMP) WITH MICROSCOPIC - Abnormal; Notable for the following components:   Color, Urine YELLOW (*)    APPearance CLOUDY (*)    Hgb urine dipstick LARGE (*)    Ketones, ur 5 (*)    Protein, ur 30 (*)    Leukocytes,Ua SMALL (*)    RBC / HPF >50 (*)    Bacteria, UA RARE (*)    All other components within normal limits  CBC WITH DIFFERENTIAL/PLATELET - Abnormal; Notable for the following components:   RBC 4.06 (*)    Hemoglobin 12.2 (*)    HCT 38.1 (*)    Abs Immature Granulocytes 0.08 (*)    All other components within normal limits  CK  CBC WITH DIFFERENTIAL/PLATELET   ____________________________________________  EKG  ED ECG REPORT I, Arta Silence, the attending physician, personally viewed and interpreted this ECG.  Date: 08/26/2018 EKG Time: 0911 Rate: 75 Rhythm: Ventricular paced rhythm ST/T Wave abnormalities: normal Narrative Interpretation: no evidence of acute ischemia;  no significant change when compared to EKG of 07/12/2018  ____________________________________________  RADIOLOGY    ____________________________________________   PROCEDURES  Procedure(s) performed: No  Procedures  Critical Care performed: No ____________________________________________   INITIAL IMPRESSION / ASSESSMENT AND PLAN / ED COURSE  Pertinent labs & imaging results that were available during my care of the patient were reviewed by me and considered in my medical decision making (see chart for details).  85 year old male with history of dementia and other PMH as noted above presents after an unwitnessed fall, found on the ground this morning.  It is unclear exactly when the patient fell.  I reviewed the past medical records  in [place].  The patient was just discharged yesterday after a subdural hematoma.  Per the discharge summary, imaging demonstrated progression of the hemorrhage into intraventricular space.  The patient is DNR/DNI and the family has requested no surgery or aggressive intervention.  On exam the patient is alert but confused, consistent with what is described in the notes from his recent admission.  His neuro exam is nonfocal.  He has no visible trauma.  Overall I suspect most likely a mechanical fall.  At this time, there is no indication for CT head or cervical spine as the patient's neuro exam is nonfocal, and he would not be a surgical candidate even if he had progression of his hemorrhage.  He has no midline spinal tenderness.  We will obtain basic labs and a UA to evaluate for any possible precipitating causes of the fall or increased weakness although I anticipate if these are negative he will be discharged back to his facility.  ----------------------------------------- 11:57 AM on 08/26/2018 -----------------------------------------  Lab work-up is unremarkable.  The patient's UA shows RBCs and WBCs with rare bacteria.  However, the patient has  an indwelling Foley so this is not really consistent with a UTI.  The patient has no elevated WBC count, fever, pain, or other signs of acute cystitis or other infection so I will not give empiric treatment at this time.  The patient is stable for discharge back to his facility.  Return precautions have been provided.  RN discussed the patient's case with his family members over the phone. ____________________________________________   FINAL CLINICAL IMPRESSION(S) / ED DIAGNOSES  Final diagnoses:  Fall, initial encounter      NEW MEDICATIONS STARTED DURING THIS VISIT:  New Prescriptions   No medications on file     Note:  This document was prepared using Dragon voice recognition software and may include unintentional dictation errors.    Arta Silence, MD 08/26/18 1158

## 2018-08-26 NOTE — ED Notes (Addendum)
Upon entrance to room pt asleep. Pt denies any needs. Asked about pain, blankets, bathroom needs... patient denies all. 300 of urine in foley bag. Paced rhythm in 70s.

## 2018-08-26 NOTE — ED Notes (Signed)
Pt urinated additional 250cc in foley cath.

## 2018-08-26 NOTE — TOC Initial Note (Signed)
TOC/SW received phone call from ED nurse regarding [name].  William Ryan is not willing to accept patient back.   Plan: TOC CM/SW  will contact facility.   TOC CM/SW Berenice Bouton, MSW, LCSW  [phone] 8am-6pm (weekends) or CSW ED # [phone]

## 2018-08-26 NOTE — ED Notes (Addendum)
Called and spoke with Kennyth Lose from [facility] who reports "I was told he isn't coming back and that you need to call the supervisor".   Attempted to call [place] supervisor Vaughan Basta): [phone]. No answer at this time.

## 2018-08-26 NOTE — ED Notes (Signed)
Patient sitting up resting on stretcher. Even and non labored respirations noted. Will continue to monitor.

## 2018-08-26 NOTE — ED Notes (Signed)
Marissa with social work called by this Therapist, sports and notified of discharge situation. She reports she will call facility and call back. Awaiting return phone call.

## 2018-08-26 NOTE — TOC Progression Note (Signed)
TOC CM/SW called William Ryan to arrange discharge to facility, [facility].  Administrator explained that "we need to secure a sitter before we could allow him to come back."  Fears that the patient is at high risk for fall needs a sitter.    TOC CM/SW called family, [name] Daughter [phone] who agreed that she will sit with her father.  She is also in contact with [facility] arranging for a sitter this coming Monday. Family will sit with this patient over the weekend. [name] noted that she lives 2 hours away from the facility.  Stated that she should be at facility in 3 hours (08/26/2018  4:00 pm)   Plan: Patient to return to facility [facility] via EMS. Daughter will call undersigned clinician [phone] when she arrives.  Clinician will notify ED charge nurse or secretary.    Berenice Bouton, MSW, LCSW  [phone] 8am-6pm (weekends) or CSW ED # [phone]

## 2018-08-26 NOTE — Discharge Instructions (Addendum)
Mr. William Ryan was evaluated in the emergency department.  At this time he has no signs of trauma.  His lab work-up is normal.  He is stable to go back to his facility.  He should return to the emergency department for new or worsening weakness, recurrent falls or new injury, or any other new or worsening symptoms that concern you.

## 2018-08-26 NOTE — ED Triage Notes (Signed)
Patient presents to ED via [facility] from [facility] post unwitnessed fall. Staff report finding patient on the floor this morning. Patient is A&O x2, disoriented to time and situation. Patient takes a daily ASA.

## 2018-08-26 NOTE — Clinical Social Work Note (Signed)
TOC CN/SW received call from family that a sitter is in place (daughter) at [facility].  Patient could safely be discharged to facility via EMS on 08/26/2018.  ED secretary notified.   TOC CM/SW signing off.   Berenice Bouton, MSW, LCSW  [phone] 8am-6pm (weekends) or CSW ED # [phone]

## 2018-10-30 DEATH — deceased

## 2020-05-18 IMAGING — CT CT HEAD WITHOUT CONTRAST
3 of 6 series · 14 of 47 positions shown, 16 images · non-contrast
Comparison: Head CT dated 08/19/2018.

CLINICAL DATA: Follow-up scan from yesterday. Subdural hemorrhage.

EXAM:
CT HEAD WITHOUT CONTRAST
TECHNIQUE: Contiguous axial images were obtained from the base of the skull
through the vertex without intravenous contrast.

[Series 4: head wo · axial · 0.47mm/px · z∈[-88,+38]mm · 8 of 33 slices shown, 10 images]
[im 4/33  brain]
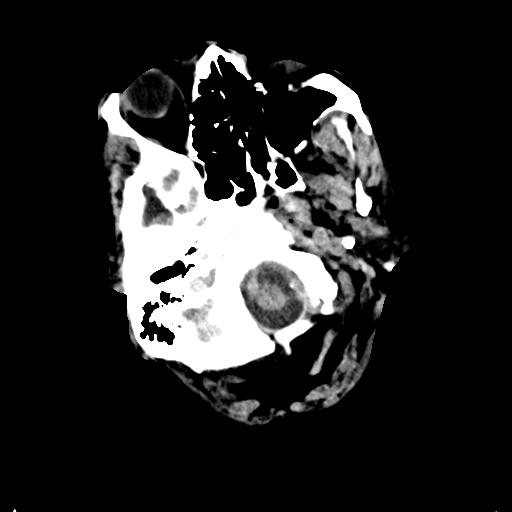
[im 4/33  bone]
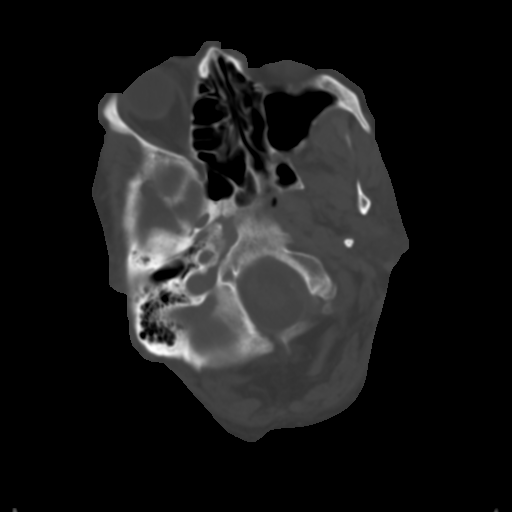
[im 7/33  brain]
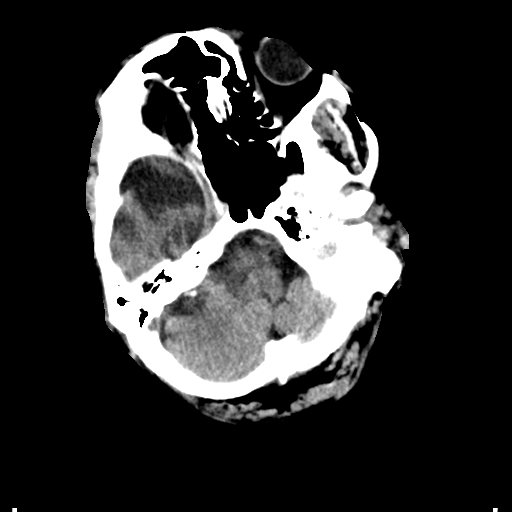
[im 11/33  brain]
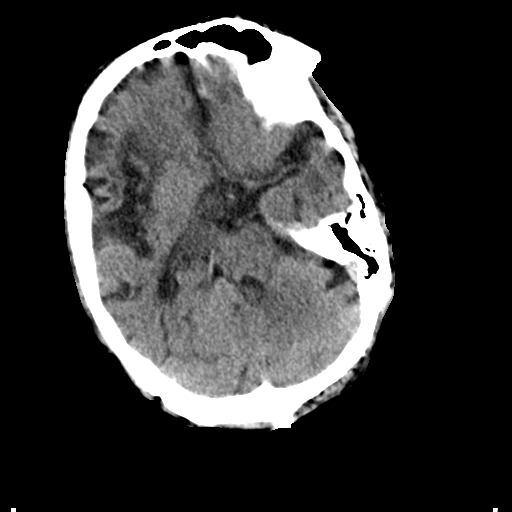
[im 14/33  brain]
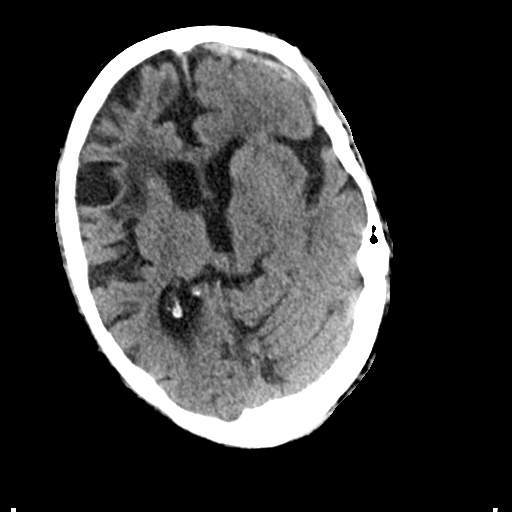
[im 19/33  brain]
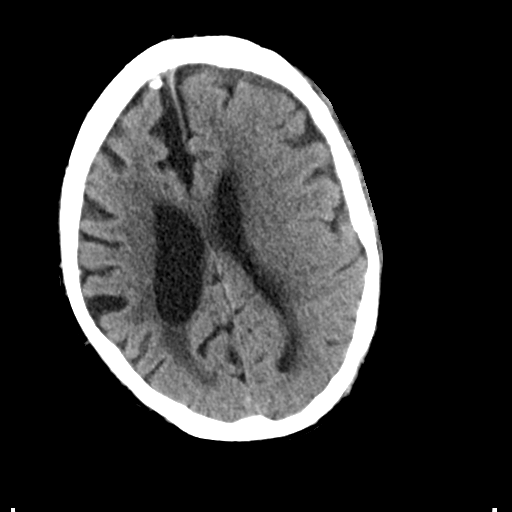
[im 19/33  bone]
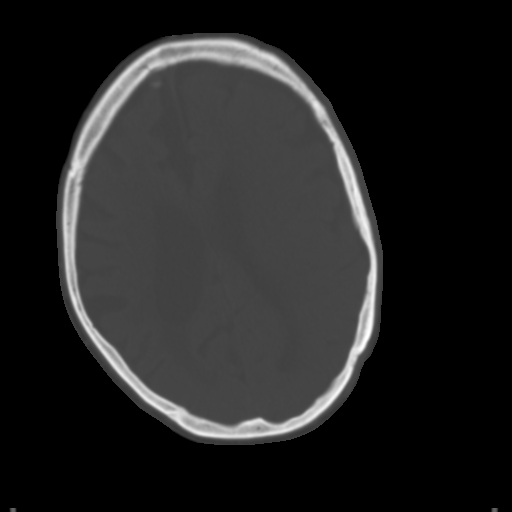
[im 22/33  brain]
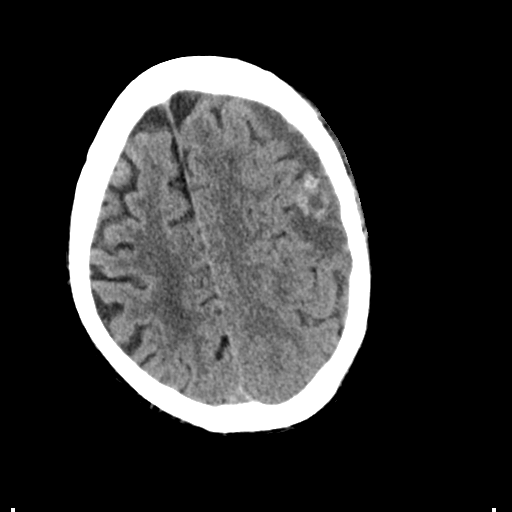
[im 26/33  brain]
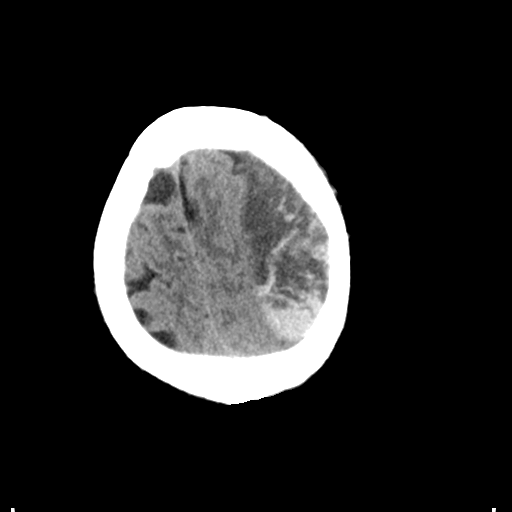
[im 29/33  brain]
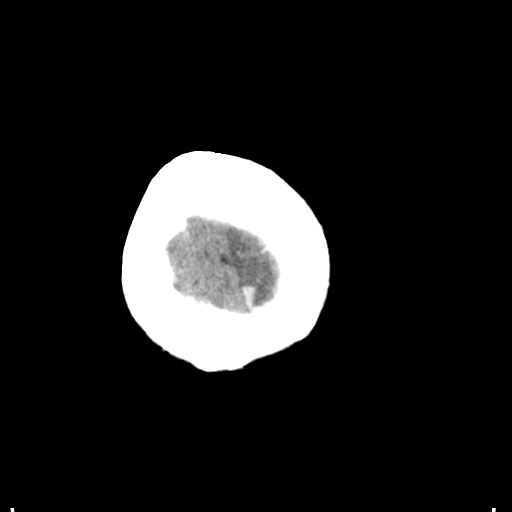

[Series 5: coronal soft tissue · coronal · 0.34mm/px · 3 of 72 slices shown]
[im 18/72  brain]
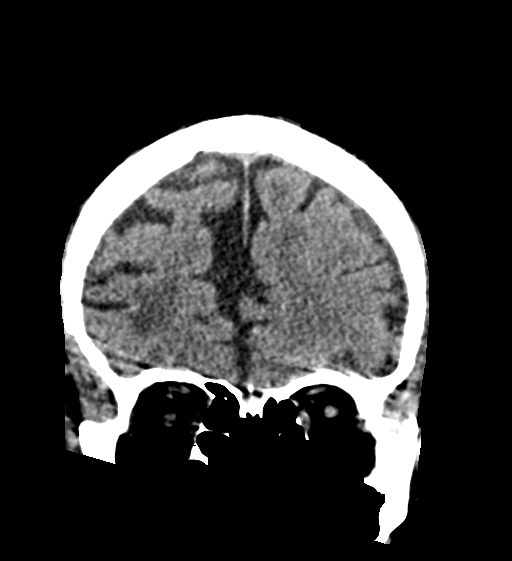
[im 36/72  brain]
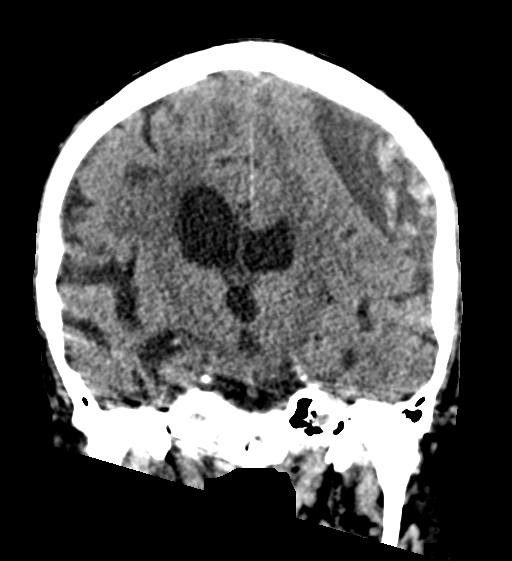
[im 54/72  brain]
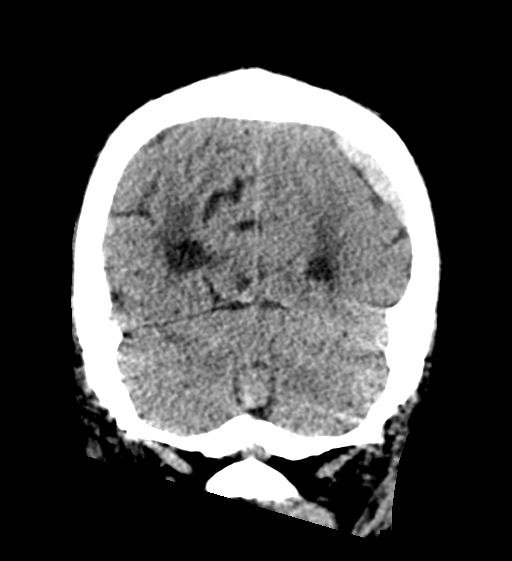

[Series 9: sagittal soft tissue · sagittal · 0.23mm/px · 3 of 57 slices shown]
[im 12/57  brain]
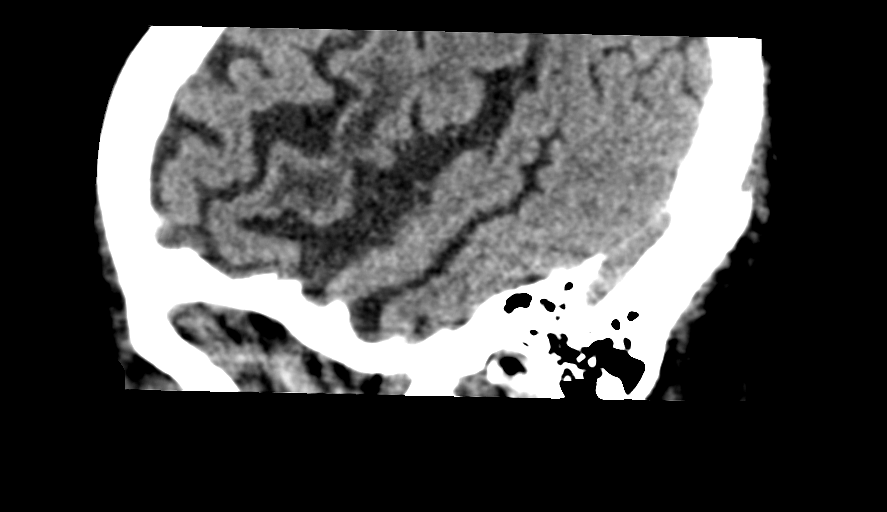
[im 23/57  brain]
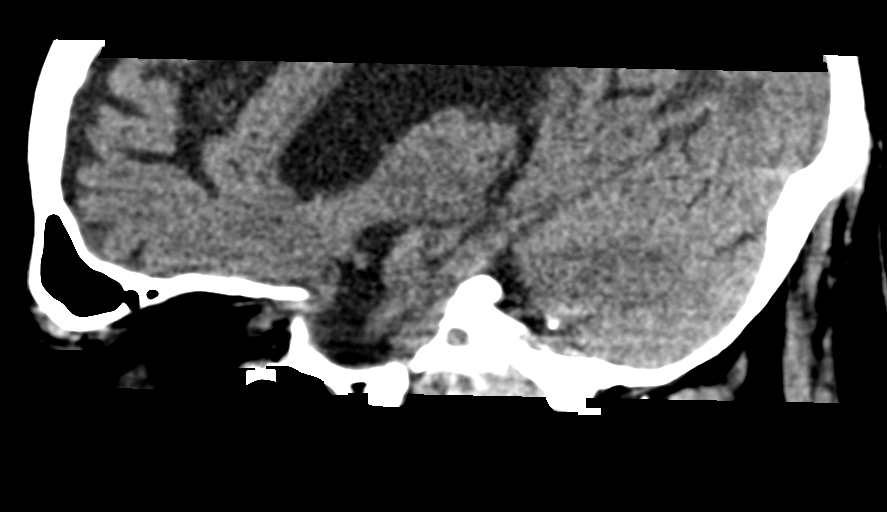
[im 34/57  brain]
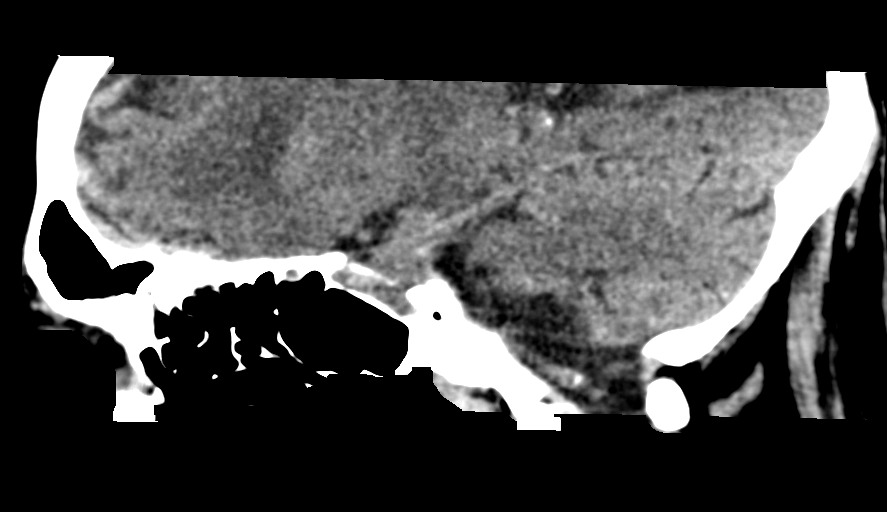

[14 of 47 positions shown; findings below may reference images not displayed]

FINDINGS: Brain: Again noted is the large mixed density subdural hemorrhage
overlying the LEFT frontal convexity, again measuring 24 mm in
thickness (best measured on coronal reconstruction images). This
subdural collection contains a mixture of intermediate and high
density blood products, stable in amount, with associated mass
effect and rightward midline shift of 6 mm which is also stable.
There is a stable small high-density subdural hematoma overlying the
LEFT frontal lobe.

No new parenchymal or extra-axial hemorrhage. There is now a small
amount of intraventricular hemorrhage seen, layering within the
posterior horns of each lateral ventricle.

Again noted is generalized parenchymal volume loss with commensurate
dilatation of the ventricles and sulci. Ventricles are stable in
size and configuration. Chronic small vessel ischemic changes again
noted within the bilateral periventricular and subcortical white
matter regions.

Vascular: No hyperdense vessel or unexpected calcification.

Skull: Normal. Negative for fracture or focal lesion.

Sinuses/Orbits: No acute finding.

Other: None.
IMPRESSION: 1. No significant interval change compared to yesterday's head CT.
Large mixed density subdural hemorrhage overlying the LEFT frontal
convexity, again measuring 24 mm in thickness, stable in amount,
with associated mass effect and rightward midline shift of 6 mm.
2. Stable small high-density subdural hematoma overlying the LEFT
frontal lobe.
3. New small amount of intraventricular hemorrhage layering within
the posterior horns of each lateral ventricle. No hydrocephalus.
4. No new parenchymal or extra-axial hemorrhage.
